# Patient Record
Sex: Male | Born: 1995 | Race: Black or African American | Hispanic: No | Marital: Single | Smoking: Never smoker
Health system: Southern US, Community
[De-identification: ages and names within clinical notes are randomized; demographics above are authoritative.]

## PROBLEM LIST (undated history)

## (undated) DIAGNOSIS — T7840XA Allergy, unspecified, initial encounter: Secondary | ICD-10-CM

## (undated) DIAGNOSIS — M1990 Unspecified osteoarthritis, unspecified site: Secondary | ICD-10-CM

## (undated) DIAGNOSIS — L309 Dermatitis, unspecified: Secondary | ICD-10-CM

## (undated) HISTORY — DX: Allergy, unspecified, initial encounter: T78.40XA

## (undated) HISTORY — PX: MOUTH SURGERY: SHX715

---
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
## 2015-08-02 IMAGING — CR DG CHEST 2V
2 series · 2 of 2 positions shown · non-contrast
Comparison: 05/19/2006

CLINICAL DATA: Right clavicle pain post injury 2 days ago

EXAM:
CHEST  2 VIEW

[w chest pa]
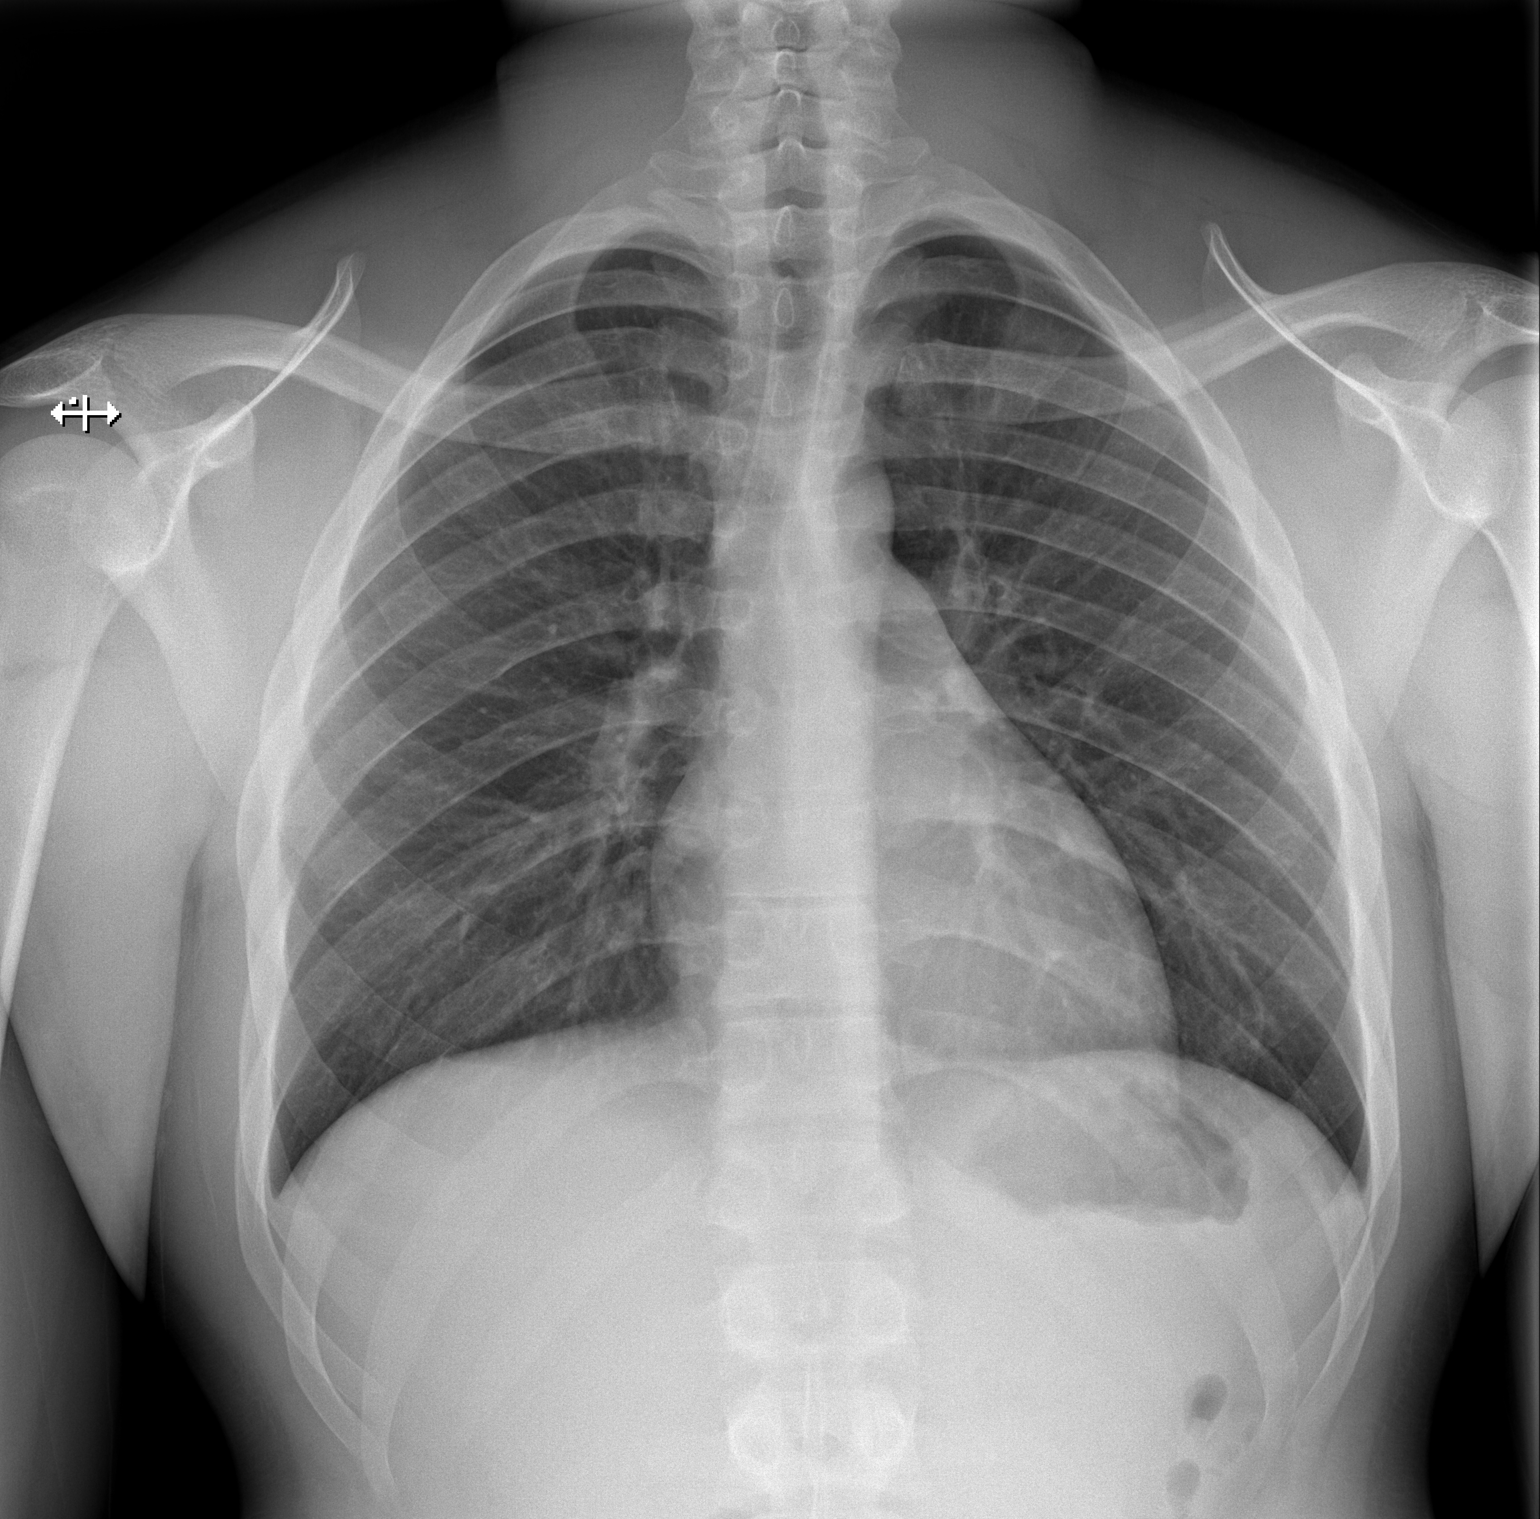

[w chest lat]
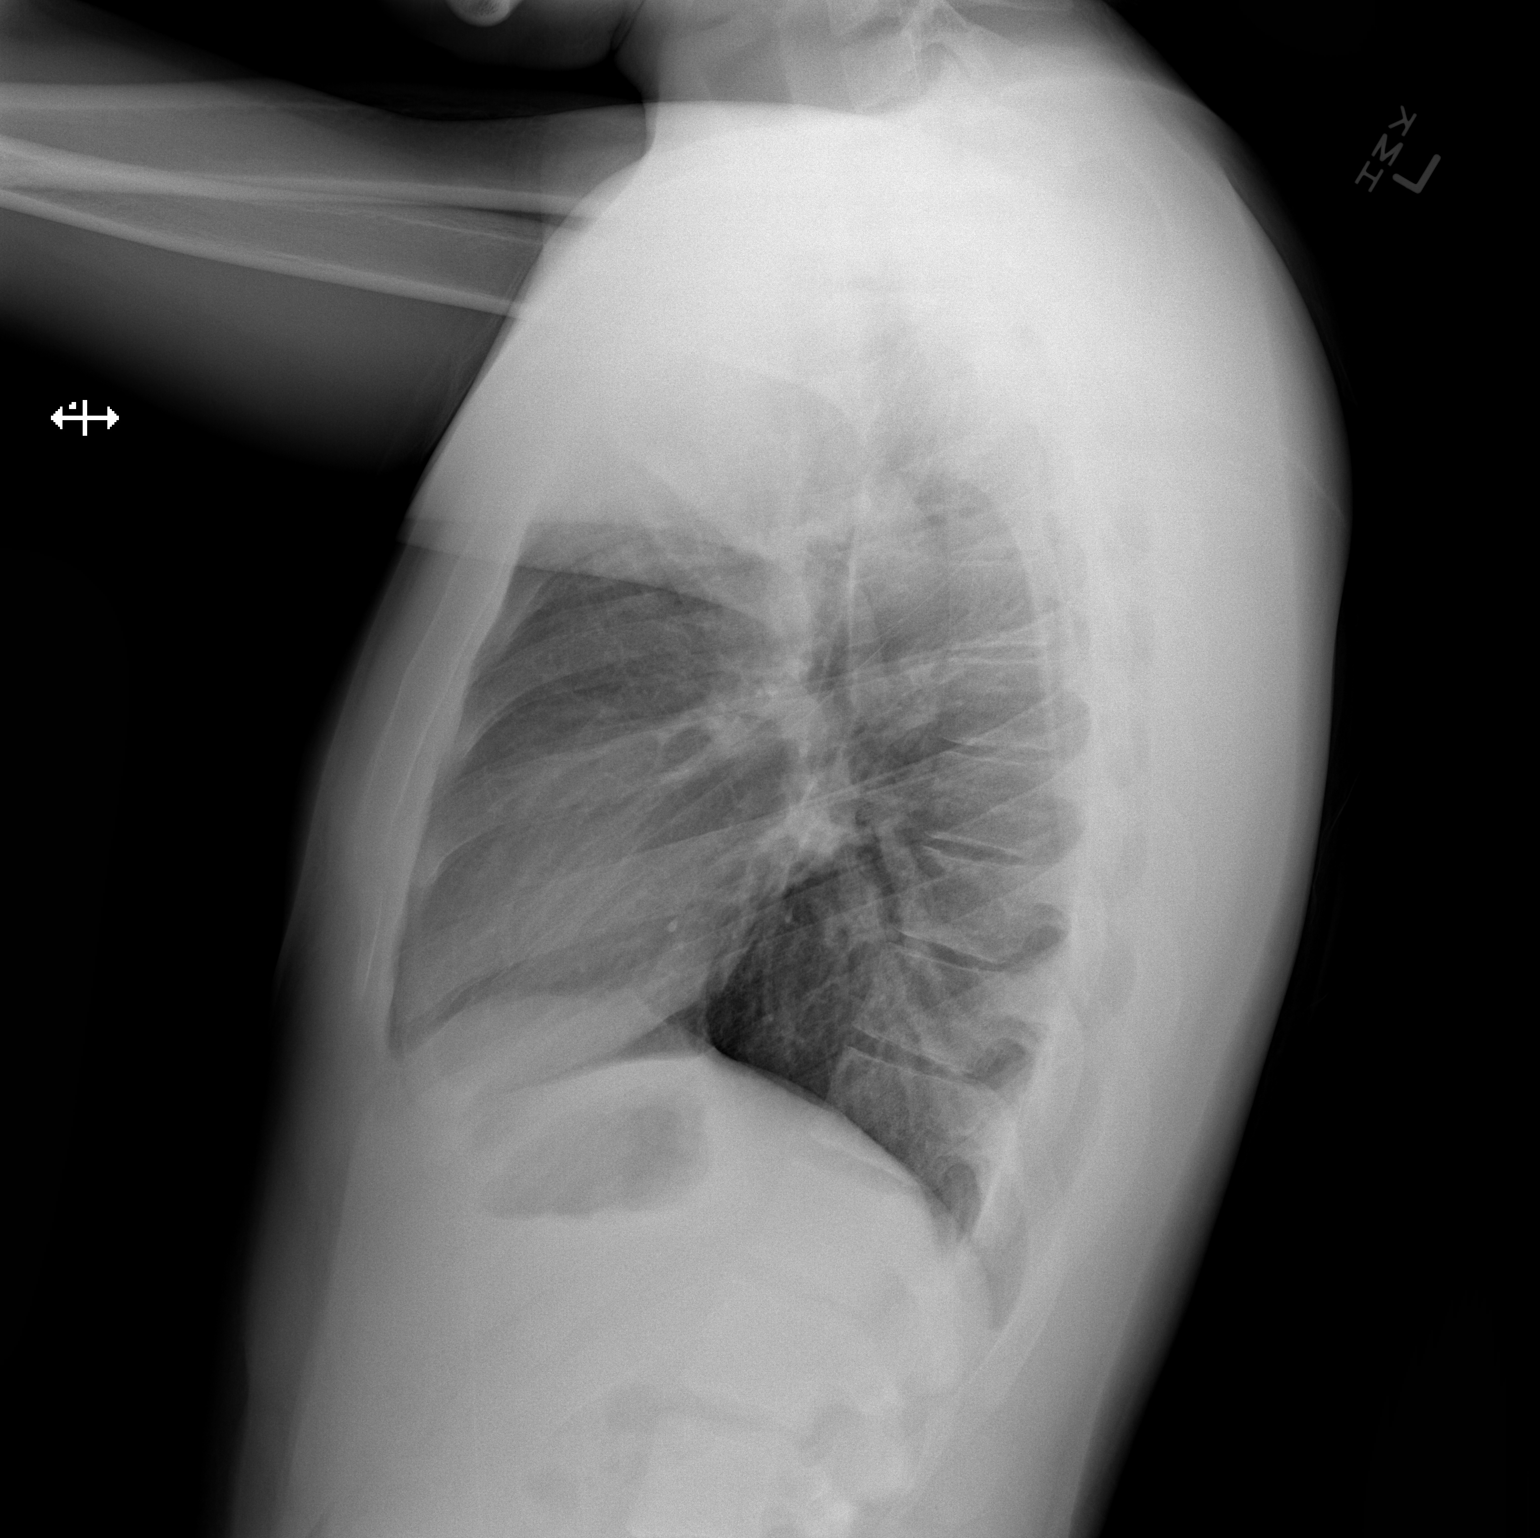

[2 of 2 positions shown; findings below may reference images not displayed]

FINDINGS: Cardiomediastinal silhouette is stable. No acute infiltrate or
pleural effusion. No pulmonary edema. No definite clavicle fracture
is noted. There is slight asymmetry with slight lower position of
the right clavicle sternal joint compared with the left. Although
this may be due positional, inferior subluxation of right clavicle
sternal joint cannot be excluded. Clinical correlation is necessary.
IMPRESSION: No acute infiltrate or pleural effusion. No pulmonary edema. No
definite clavicle fracture is noted. There is slight asymmetry with
slight lower position of the right clavicle sternal joint compared
with the left. Although this may be due positional, inferior
subluxation of right clavicle sternal joint cannot be excluded.
Clinical correlation is necessary.

## 2017-09-12 ENCOUNTER — Emergency Department (HOSPITAL_BASED_OUTPATIENT_CLINIC_OR_DEPARTMENT_OTHER)
Admission: EM | Admit: 2017-09-12 | Discharge: 2017-09-12 | Disposition: A | Discharge status: Home / Self Care | Payer: Self-pay | Attending: Emergency Medicine | Admitting: Emergency Medicine

## 2017-09-12 ENCOUNTER — Other Ambulatory Visit: Payer: Self-pay

## 2017-09-12 ENCOUNTER — Encounter (HOSPITAL_BASED_OUTPATIENT_CLINIC_OR_DEPARTMENT_OTHER): Payer: Self-pay | Admitting: Emergency Medicine

## 2017-09-12 DIAGNOSIS — Z7722 Contact with and (suspected) exposure to environmental tobacco smoke (acute) (chronic): Secondary | ICD-10-CM | POA: Insufficient documentation

## 2017-09-12 DIAGNOSIS — A084 Viral intestinal infection, unspecified: Secondary | ICD-10-CM | POA: Insufficient documentation

## 2017-09-12 MED ORDER — ONDANSETRON 8 MG PO TBDP: 8.0000 mg | ORAL_TABLET | Freq: Three times a day (TID) | ORAL | 0 refills | Status: DC | PRN

## 2017-09-12 MED ORDER — SODIUM CHLORIDE 0.9 % IV BOLUS (SEPSIS)
1000.0000 mL | Freq: Once | INTRAVENOUS | Status: AC
  Administered 2017-09-12: 1000 mL via INTRAVENOUS

## 2017-09-12 MED ORDER — KETOROLAC TROMETHAMINE 15 MG/ML IJ SOLN
INTRAMUSCULAR | Status: AC
  Administered 2017-09-12: 15 mg via INTRAVENOUS
  Filled 2017-09-12: qty 1

## 2017-09-12 MED ORDER — ONDANSETRON HCL 4 MG/2ML IJ SOLN
4.0000 mg | Freq: Once | INTRAMUSCULAR | Status: AC
  Administered 2017-09-12: 4 mg via INTRAVENOUS

## 2017-09-12 MED ORDER — ONDANSETRON HCL 4 MG/2ML IJ SOLN
INTRAMUSCULAR | Status: AC
  Administered 2017-09-12: 4 mg via INTRAVENOUS
  Filled 2017-09-12: qty 2

## 2017-09-12 MED ORDER — KETOROLAC TROMETHAMINE 15 MG/ML IJ SOLN
15.0000 mg | Freq: Once | INTRAMUSCULAR | Status: AC
  Administered 2017-09-12: 15 mg via INTRAVENOUS

## 2017-09-12 NOTE — ED Triage Notes (Signed)
PT presents with c/o generalized body aches, and vomiting for 3 days.

## 2017-09-12 NOTE — ED Notes (Signed)
Pt able to drink without diff

## 2017-09-12 NOTE — ED Provider Notes (Signed)
   [facility] Provider Note: Lowella DellJ. Lane Delfino Friesen, MD, FACEP  CSN: [id number] MRN: [id number] ARRIVAL: 09/12/17 at 0008 ROOM: [facility]07/[facility]07   CHIEF COMPLAINT  Vomiting   HISTORY OF PRESENT ILLNESS  09/12/17 1:55 AM Adam Bernard is a 21 y.o. male with a 3-day history of fever, body aches, headache, nausea, vomiting, diarrhea and abdominal cramping.  He has been taking ibuprofen with partial relief of his headaches.  He has been trying to drink adequate fluids but has not been eating solids.  He rates his current pain is a 9 out of 10.  He denies respiratory symptoms.   Past Medical History:  Diagnosis Date  . Arthritis     History reviewed. No pertinent surgical history.  No family history on file.  Social History   Tobacco Use  . Smoking status: Passive Smoke Exposure - Never Smoker  Substance Use Topics  . Alcohol use: No  . Drug use: No    Prior to Admission medications   Medication Sig Start Date End Date Taking? Authorizing Provider  HYDROcodone-acetaminophen (NORCO/VICODIN) 5-325 MG per tablet Take 2 tablets by mouth every 4 (four) hours as needed. 11/21/14   Joycie Peekartner, Benjamin, PA-C    Allergies Patient has no known allergies.   REVIEW OF SYSTEMS  Negative except as noted here or in the History of Present Illness.   PHYSICAL EXAMINATION  Initial Vital Signs Blood pressure 125/71, pulse 63, temperature 97.8 F (36.6 C), temperature source Oral, resp. rate 18, SpO2 100 %.  Examination General: Well-developed, well-nourished male in no acute distress; appearance consistent with age of record HENT: normocephalic; atraumatic Eyes: pupils equal, round and reactive to light; extraocular muscles intact Neck: supple Heart: regular rate and rhythm Lungs: clear to auscultation bilaterally Abdomen: soft; nondistended; mild epigastric tenderness; no masses or hepatosplenomegaly; bowel sounds present Extremities: No deformity; full range of motion; pulses  normal Neurologic: Awake, alert and oriented; motor function intact in all extremities and symmetric; no facial droop Skin: Warm and dry Psychiatric: Normal mood and affect   RESULTS  Summary of this visit's results, reviewed by myself:   EKG Interpretation  Date/Time:    Ventricular Rate:    PR Interval:    QRS Duration:   QT Interval:    QTC Calculation:   R Axis:     Text Interpretation:        Laboratory Studies: No results found for this or any previous visit (from the past 24 hour(s)). Imaging Studies: No results found.  ED COURSE  Nursing notes and initial vitals signs, including pulse oximetry, reviewed.  Vitals:   09/12/17 0017  BP: 125/71  Pulse: 63  Resp: 18  Temp: 97.8 F (36.6 C)  TempSrc: Oral  SpO2: 100%   4:17 AM Patient feeling better after IV fluids medication.  Drinking fluids without emesis.  PROCEDURES    ED DIAGNOSES     ICD-10-CM   1. Viral gastroenteritis A08.4        Sulaiman Imbert, MD 09/12/17 [phone]

## 2019-04-28 ENCOUNTER — Other Ambulatory Visit: Payer: Self-pay

## 2019-04-28 ENCOUNTER — Ambulatory Visit: Payer: Self-pay | Admitting: Family Medicine

## 2019-04-28 ENCOUNTER — Other Ambulatory Visit (HOSPITAL_COMMUNITY)
Admission: RE | Admit: 2019-04-28 | Discharge: 2019-04-28 | Disposition: A | Discharge status: Home / Self Care | Payer: Medicaid Other | Source: Ambulatory Visit | Attending: Family Medicine | Admitting: Family Medicine

## 2019-04-28 ENCOUNTER — Encounter: Payer: Self-pay | Admitting: Family Medicine

## 2019-04-28 VITALS — BP 118/70 | HR 68 | Ht 75.0 in | Wt 180.8 lb

## 2019-04-28 DIAGNOSIS — Z0000 Encounter for general adult medical examination without abnormal findings: Secondary | ICD-10-CM

## 2019-04-28 DIAGNOSIS — Z113 Encounter for screening for infections with a predominantly sexual mode of transmission: Secondary | ICD-10-CM | POA: Insufficient documentation

## 2019-04-28 DIAGNOSIS — R369 Urethral discharge, unspecified: Secondary | ICD-10-CM

## 2019-04-28 DIAGNOSIS — N368 Other specified disorders of urethra: Secondary | ICD-10-CM

## 2019-04-28 MED ORDER — AZITHROMYCIN 250 MG PO TABS: 1000.0000 mg | ORAL_TABLET | Freq: Once | ORAL | 0 refills | Status: AC

## 2019-04-28 NOTE — Progress Notes (Signed)
Adam Bernard is a 23 y.o. male  Chief Complaint  Patient presents with  . Establish Care    CPE/ denies TDAP/ wants STD testing uncomfortable on top of penis    HPI: *Adam Bernard is a 23 y.o. male here for CPE as a new patient to our office.  He complains of irritation at the tip of his penis, burning sensation with ejaculation, and intermittent, scant urethral discharge. Symptoms x 3 days. No fever, chills. No body aches. No rash, lesions. He is sexually active but denies having unprotected sex.  No known exposure to STD. No dysuria, urgency, frequency, hematuria.   No past medical history on file.  No past surgical history on file.  Social History   Socioeconomic History  . Marital status: Single    Spouse name: Not on file  . Number of children: Not on file  . Years of education: Not on file  . Highest education level: Not on file  Occupational History  . Not on file  Social Needs  . Financial resource strain: Not on file  . Food insecurity    Worry: Not on file    Inability: Not on file  . Transportation needs    Medical: Not on file    Non-medical: Not on file  Tobacco Use  . Smoking status: Light Tobacco Smoker  . Smokeless tobacco: Never Used  Substance and Sexual Activity  . Alcohol use: No  . Drug use: No  . Sexual activity: Not on file  Lifestyle  . Physical activity    Days per week: Not on file    Minutes per session: Not on file  . Stress: Not on file  Relationships  . Social Herbalist on phone: Not on file    Gets together: Not on file    Attends religious service: Not on file    Active member of club or organization: Not on file    Attends meetings of clubs or organizations: Not on file    Relationship status: Not on file  . Intimate partner violence    Fear of current or ex partner: Not on file    Emotionally abused: Not on file    Physically abused: Not on file    Forced sexual activity: Not on file  Other Topics  Concern  . Not on file  Social History Narrative  . Not on file    No family history on file.    There is no immunization history on file for this patient.  Outpatient Encounter Medications as of 04/28/2019  Medication Sig  . azithromycin (ZITHROMAX) 250 MG tablet Take 4 tablets (1,000 mg total) by mouth once for 1 dose.  . [DISCONTINUED] ondansetron (ZOFRAN ODT) 8 MG disintegrating tablet Take 1 tablet (8 mg total) by mouth every 8 (eight) hours as needed.   No facility-administered encounter medications on file as of 04/28/2019.      ROS: Gen: no fever, chills  Skin: no rash, itching ENT: no ear pain, ear drainage, nasal congestion, rhinorrhea, sinus pressure, sore throat Eyes: no blurry vision, double vision Resp: no cough, wheeze,SOB CV: no CP, palpitations, LE edema,  GI: no heartburn, n/v/d/c, abd pain GU: no dysuria, urgency, frequency, hematuria; as above in HPI MSK: no joint pain, myalgias, back pain Neuro: no dizziness, headache, weakness, vertigo Psych: no depression, anxiety, insomnia   No Known Allergies  BP 118/70   Pulse 68   Ht 6\' 3"  (  1.905 m)   Wt 180 lb 12.8 oz (82 kg)   SpO2 99%   BMI 22.60 kg/m   Physical Exam  Constitutional: He is oriented to person, place, and time. He appears well-developed and well-nourished. No distress.  HENT:  Head: Normocephalic and atraumatic.  Right Ear: Tympanic membrane and ear canal normal.  Left Ear: Tympanic membrane and ear canal normal.  Nose: Nose normal.  Mouth/Throat: Oropharynx is clear and moist and mucous membranes are normal.  Neck: Neck supple. No thyromegaly present.  Cardiovascular: Normal rate, regular rhythm, normal heart sounds and intact distal pulses.  Pulmonary/Chest: Effort normal and breath sounds normal. He has no wheezes. He has no rhonchi.  Abdominal: Soft. Bowel sounds are normal. He exhibits no distension and no mass. There is no abdominal tenderness. There is no rebound and no guarding.   Genitourinary:    Testes and penis normal.  No penile tenderness. No discharge found.  Musculoskeletal: Normal range of motion.        General: No edema.  Lymphadenopathy:    He has no cervical adenopathy.       Right: No inguinal adenopathy present.       Left: No inguinal adenopathy present.  Neurological: He is alert and oriented to person, place, and time. He exhibits normal muscle tone. Coordination normal.  Skin: Skin is warm and dry.  Psychiatric: He has a normal mood and affect.     A/P:  1. Screening for STD (sexually transmitted disease) 2. Urethral irritation 3. Urethral discharge - Hepatitis B surface antigen - HIV Antibody (routine testing w rflx) - RPR - Hepatitis C antibody - Urine cytology ancillary only([place]) - will treat empirically for chlamydia and contact pt once results available Rx: - azithromycin (ZITHROMAX) 250 MG tablet; Take 4 tablets (1,000 mg total) by mouth once for 1 dose.  Dispense: 4 tablet; Refill: 0 Discussed plan and reviewed medications with patient, including risks, benefits, and potential side effects. Pt expressed understand. All questions answered.  4. Annual physical exam - UTD on dental, no vision issues - declines HPV, Tdap vaccines - discussed importance of regular CV exercise, healthy diet, adequate sleep - discussed importance of safe sex practices - next CPE in 1 year

## 2019-04-29 LAB — HEPATITIS B SURFACE ANTIGEN: Hepatitis B Surface Ag: NEGATIVE

## 2019-04-29 LAB — HEPATITIS C ANTIBODY: Hep C Virus Ab: <0.1 s/co ratio (ref 0.0–0.9)

## 2019-04-29 LAB — HIV ANTIBODY (ROUTINE TESTING W REFLEX): HIV Screen 4th Generation wRfx: NONREACTIVE

## 2019-04-29 LAB — RPR: RPR Ser Ql: NONREACTIVE

## 2019-04-30 LAB — URINE CYTOLOGY ANCILLARY ONLY
Chlamydia: POSITIVE — AB
Neisseria Gonorrhea: NEGATIVE

## 2019-06-30 ENCOUNTER — Telehealth: Payer: Self-pay

## 2019-06-30 NOTE — Telephone Encounter (Signed)

## 2019-07-01 ENCOUNTER — Ambulatory Visit: Payer: Self-pay | Admitting: Family Medicine

## 2019-07-01 ENCOUNTER — Encounter: Payer: Self-pay | Admitting: Family Medicine

## 2019-07-01 ENCOUNTER — Other Ambulatory Visit (HOSPITAL_COMMUNITY)
Admission: RE | Admit: 2019-07-01 | Discharge: 2019-07-01 | Disposition: A | Discharge status: Home / Self Care | Payer: Medicaid Other | Source: Ambulatory Visit | Attending: Family Medicine | Admitting: Family Medicine

## 2019-07-01 ENCOUNTER — Other Ambulatory Visit: Payer: Self-pay

## 2019-07-01 VITALS — BP 116/66 | HR 72 | Temp 98.0°F | Ht 75.0 in | Wt 174.4 lb

## 2019-07-01 DIAGNOSIS — Z8619 Personal history of other infectious and parasitic diseases: Secondary | ICD-10-CM

## 2019-07-01 DIAGNOSIS — Z202 Contact with and (suspected) exposure to infections with a predominantly sexual mode of transmission: Secondary | ICD-10-CM

## 2019-07-01 DIAGNOSIS — R369 Urethral discharge, unspecified: Secondary | ICD-10-CM

## 2019-07-01 DIAGNOSIS — Z2821 Immunization not carried out because of patient refusal: Secondary | ICD-10-CM

## 2019-07-01 MED ORDER — AZITHROMYCIN 250 MG PO TABS: 1000.0000 mg | ORAL_TABLET | Freq: Once | ORAL | 0 refills | Status: AC

## 2019-07-01 NOTE — Progress Notes (Signed)
Adam Bernard is a 23 y.o. male  Chief Complaint  Patient presents with  . Follow-up    Pt is here today requesting to have complete screening for STD's. He is due for his Tdap and Flu which he declines. He states that his GF was tested yesterday at [facility] and is waiting for results.    HPI: Adam Bernard is a 23 y.o. male here requesting STD testing. Pt complains of urethral discharge and discomfort at the tip of penis x 4 days. No dysuria. No rash.  His girlfriend was tested yesterday, results pending.  He is sexually active, does not consistently use condoms.  Pt denies fever, chills, n/v, rash.  Pt was treated for chlamydia in 03/2019.  Pt declines flu and Tdap vaccines.  History reviewed. No pertinent past medical history.  History reviewed. No pertinent surgical history.  Social History   Socioeconomic History  . Marital status: Single    Spouse name: Not on file  . Number of children: Not on file  . Years of education: Not on file  . Highest education level: Not on file  Occupational History  . Not on file  Social Needs  . Financial resource strain: Not on file  . Food insecurity    Worry: Not on file    Inability: Not on file  . Transportation needs    Medical: Not on file    Non-medical: Not on file  Tobacco Use  . Smoking status: Light Tobacco Smoker  . Smokeless tobacco: Never Used  Substance and Sexual Activity  . Alcohol use: No  . Drug use: No  . Sexual activity: Not on file  Lifestyle  . Physical activity    Days per week: Not on file    Minutes per session: Not on file  . Stress: Not on file  Relationships  . Social Herbalist on phone: Not on file    Gets together: Not on file    Attends religious service: Not on file    Active member of club or organization: Not on file    Attends meetings of clubs or organizations: Not on file    Relationship status: Not on file  . Intimate partner violence    Fear of current or ex  partner: Not on file    Emotionally abused: Not on file    Physically abused: Not on file    Forced sexual activity: Not on file  Other Topics Concern  . Not on file  Social History Narrative  . Not on file    History reviewed. No pertinent family history.   Immunization History  Administered Date(s) Administered  . DTaP 09/19/1996, 12/12/1996, 02/13/1997, 12/17/1997, 06/29/2000  . HPV Quadrivalent 11/14/2009, 01/22/2010, 05/27/2010  . Hepatitis B [birth year] 1996, 09/19/1996, 02/13/1997  . HiB (PRP-OMP) 08/20/1996, 12/12/1996, 02/13/1997, 11/28/1997  . IPV 09/19/1996, 12/12/1996, 02/13/1997, 06/29/2000  . Influenza-Unspecified 07/22/2001, 08/23/2001, 07/09/2010, 05/28/2012, 08/03/2014  . MMR 11/28/1997, 06/29/2000  . Varicella 11/28/1997    No outpatient encounter medications on file as of 07/01/2019.   No facility-administered encounter medications on file as of 07/01/2019.      ROS: Pertinent positives and negatives noted in HPI. Remainder of ROS non-contributory   No Known Allergies  BP 116/66 (BP Location: Right Arm, Patient Position: Sitting, Cuff Size: Normal)   Pulse 72   Temp 98 F (36.7 C) (Oral)   Ht _0  (1.905 m)   Wt 174 lb 6.4 oz (79.1 kg)  SpO2 98%   BMI 21.80 kg/m   Physical Exam  Constitutional: He is oriented to person, place, and time. He appears well-developed and well-nourished. No distress.  Genitourinary:    Testes and penis normal.  No penile erythema or penile tenderness. No discharge found.  Neurological: He is alert and oriented to person, place, and time.  Psychiatric: He has a normal mood and affect. His behavior is normal.     A/P:  1. Urethral discharge 2. H/O chlamydia infection 3. Possible exposure to STD - Hepatitis B surface antigen - HIV Antibody (routine testing w rflx) - RPR - Hepatitis C Antibody - Urine cytology ancillary only([place]) Rx: - azithromycin (ZITHROMAX) 250 MG tablet; Take 4 tablets (1,000 mg total)  by mouth once for 1 dose.  Dispense: 4 tablet; Refill: 0 - partner has been tested and results pending - discussed importance of safe sex practice including regular and consistent condom use - f/u PRN Discussed plan and reviewed medications with patient, including risks, benefits, and potential side effects. Pt expressed understand. All questions answered.   3. Influenza vaccination declined by patient  4. Tetanus, diphtheria, and acellular pertussis (Tdap) vaccination declined

## 2019-07-01 NOTE — Patient Instructions (Signed)

## 2019-07-04 LAB — URINE CYTOLOGY ANCILLARY ONLY
Chlamydia: POSITIVE — AB
Neisseria Gonorrhea: NEGATIVE
Trichomonas: NEGATIVE

## 2019-07-04 LAB — RPR: RPR Ser Ql: NONREACTIVE

## 2019-07-04 LAB — HIV ANTIBODY (ROUTINE TESTING W REFLEX): HIV 1&2 Ab, 4th Generation: NONREACTIVE

## 2019-07-04 LAB — HEPATITIS B SURFACE ANTIGEN: Hepatitis B Surface Ag: NONREACTIVE

## 2019-07-04 LAB — HEPATITIS C ANTIBODY
Hepatitis C Ab: NONREACTIVE
SIGNAL TO CUT-OFF: 0.06 (ref <1.00)

## 2020-02-10 ENCOUNTER — Ambulatory Visit: Payer: Medicaid Other | Admitting: Family Medicine

## 2020-02-29 ENCOUNTER — Other Ambulatory Visit: Payer: Self-pay

## 2020-03-01 ENCOUNTER — Encounter: Payer: Self-pay | Admitting: Family Medicine

## 2020-03-01 ENCOUNTER — Other Ambulatory Visit (HOSPITAL_COMMUNITY)
Admission: RE | Admit: 2020-03-01 | Discharge: 2020-03-01 | Disposition: A | Discharge status: Home / Self Care | Payer: Medicaid Other | Source: Ambulatory Visit | Attending: Family Medicine | Admitting: Family Medicine

## 2020-03-01 ENCOUNTER — Ambulatory Visit: Payer: Self-pay | Admitting: Family Medicine

## 2020-03-01 VITALS — BP 100/80 | HR 78 | Temp 98.3°F | Ht 73.0 in | Wt 171.2 lb

## 2020-03-01 DIAGNOSIS — Z113 Encounter for screening for infections with a predominantly sexual mode of transmission: Secondary | ICD-10-CM | POA: Diagnosis present

## 2020-03-01 DIAGNOSIS — Z0000 Encounter for general adult medical examination without abnormal findings: Secondary | ICD-10-CM

## 2020-03-01 NOTE — Progress Notes (Signed)
Adam Bernard is a 24 y.o. male  Chief Complaint  Patient presents with  . Annual Exam    Pt c/o rt hand pain, x 1 week ago.  Pt said that he has been taking Ibuprofen for the pain it has eased up.    HPI: Adam Bernard is a 24 y.o. male here for annual CPE.   Pt states he hurt his Rt hand likely at work 1 week ago. He has been taking ibuprofen and pain has improved.  No weakness. No numbness or tingling.  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 No symptoms or known exposures.   History reviewed. No pertinent past medical history.  History reviewed. No pertinent surgical history.  Social History   Socioeconomic History  . Marital status: Single    Spouse name: Not on file  . Number of children: Not on file  . Years of education: Not on file  . Highest education level: Not on file  Occupational History  . Not on file  Tobacco Use  . Smoking status: Former Research scientist (life sciences)  . Smokeless tobacco: Never Used  Substance and Sexual Activity  . Alcohol use: No  . Drug use: No  . Sexual activity: Not on file  Other Topics Concern  . Not on file  Social History Narrative  . Not on file   Social Determinants of Health   Financial Resource Strain:   . Difficulty of Paying Living Expenses:   Food Insecurity:   . Worried About Charity fundraiser in the Last Year:   . Arboriculturist in the Last Year:   Transportation Needs:   . Film/video editor (Medical):   Marland Kitchen Lack of Transportation (Non-Medical):   Physical Activity:   . Days of Exercise per Week:   . Minutes of Exercise per Session:   Stress:   . Feeling of Stress :   Social Connections:   . Frequency of Communication with Friends and Family:   . Frequency of Social Gatherings with Friends and Family:   . Attends Religious Services:   . Active Member  of Clubs or Organizations:   . Attends Archivist Meetings:   Marland Kitchen Marital Status:   Intimate Partner Violence:   . Fear of Current or Ex-Partner:   . Emotionally Abused:   Marland Kitchen Physically Abused:   . Sexually Abused:     History reviewed. No pertinent family history.   Immunization History  Administered Date(s) Administered  . DTaP 09/19/1996, 12/12/1996, 02/13/1997, 12/17/1997, 06/29/2000  . HPV Quadrivalent 11/14/2009, 01/22/2010, 05/27/2010  . Hepatitis B 02/18/96, 09/19/1996, 02/13/1997  . HiB (PRP-OMP) 08/20/1996, 12/12/1996, 02/13/1997, 11/28/1997  . IPV 09/19/1996, 12/12/1996, 02/13/1997, 06/29/2000  . Influenza-Unspecified 07/22/2001, 08/23/2001, 07/09/2010, 05/28/2012, 08/03/2014  . MMR 11/28/1997, 06/29/2000  . Varicella 11/28/1997    No outpatient encounter medications on file as of 03/01/2020.   No facility-administered encounter medications on file as of 03/01/2020.     ROS: Gen: no fever, chills  Skin: no rash, itching ENT: no ear pain, ear drainage, nasal congestion, rhinorrhea, sinus pressure, sore throat Eyes: no blurry vision, double vision Resp: no cough, wheeze,SOB CV: no CP, palpitations, LE edema,  GI: no heartburn, n/v/d/c, abd pain GU: no dysuria, urgency, frequency, hematuria; no testicular swelling or masses MSK: as above in HPI Neuro: no dizziness, headache, weakness Psych:  no depression, anxiety, insomnia   No Known Allergies  BP 100/80 (BP Location: Left Arm, Patient Position: Sitting, Cuff Size: Normal)   Pulse 78   Temp 98.3 F (36.8 C) (Temporal)   Ht 6' 1" (1.854 m)   Wt 171 lb 3.2 oz (77.7 kg)   SpO2 98%   BMI 22.59 kg/m   Physical Exam  Constitutional: He is oriented to person, place, and time. He appears well-developed and well-nourished. No distress.  HENT:  Head: Normocephalic and atraumatic.  Right Ear: Tympanic membrane and ear canal normal.  Left Ear: Tympanic membrane and ear canal normal.  Nose: Nose normal.    Mouth/Throat: Oropharynx is clear and moist and mucous membranes are normal.  Neck: No thyromegaly present.  Cardiovascular: Normal rate, regular rhythm, normal heart sounds and intact distal pulses.  Pulmonary/Chest: Effort normal and breath sounds normal. He has no wheezes. He has no rhonchi.  Abdominal: Soft. Bowel sounds are normal. He exhibits no distension and no mass. There is no abdominal tenderness. There is no rebound and no guarding.  Musculoskeletal:        General: No edema. Normal range of motion.     Cervical back: Neck supple.  Lymphadenopathy:    He has no cervical adenopathy.  Neurological: He is alert and oriented to person, place, and time. He exhibits normal muscle tone. Coordination normal.  Skin: Skin is warm and dry.  Psychiatric: He has a normal mood and affect.     A/P:  1. Annual physical exam - due for dental exam/cleaning - discussed importance of regular CV exercise, healthy diet, adequate sleep - immunizations UTD - ALT - AST - Basic metabolic panel - Lipid panel - next CPE in 1 year  2. Screening examination for STD (sexually transmitted disease) - Hepatitis B surface antigen - HIV Antibody (routine testing w rflx) - RPR - Urine cytology ancillary only([place]) - Hepatitis C Antibody    This visit occurred during the SARS-CoV-2 public health emergency.  Safety protocols were in place, including screening questions prior to the visit, additional usage of staff PPE, and extensive cleaning of exam room while observing appropriate contact time as indicated for disinfecting solutions.  

## 2020-03-01 NOTE — Patient Instructions (Signed)
Dr. Lysbeth Penner (dentist)  [facility]

## 2020-03-02 LAB — BASIC METABOLIC PANEL
BUN: 16 mg/dL (ref 6–23)
CO2: 27 mEq/L (ref 19–32)
Calcium: 9.7 mg/dL (ref 8.4–10.5)
Chloride: 104 mEq/L (ref 96–112)
Creatinine, Ser: 0.94 mg/dL (ref 0.40–1.50)
GFR: 119.63 mL/min (ref >60.00)
Glucose, Bld: 81 mg/dL (ref 70–99)
Potassium: 4 mEq/L (ref 3.5–5.1)
Sodium: 137 mEq/L (ref 135–145)

## 2020-03-02 LAB — RPR: RPR Ser Ql: NONREACTIVE

## 2020-03-02 LAB — LIPID PANEL
Cholesterol: 128 mg/dL (ref 0–200)
HDL: 56.1 mg/dL (ref >39.00)
LDL Cholesterol: 64 mg/dL (ref 0–99)
NonHDL: 71.97
Total CHOL/HDL Ratio: 2
Triglycerides: 38 mg/dL (ref 0.0–149.0)
VLDL: 7.6 mg/dL (ref 0.0–40.0)

## 2020-03-02 LAB — AST: AST: 16 U/L (ref 0–37)

## 2020-03-02 LAB — HEPATITIS C ANTIBODY
Hepatitis C Ab: NONREACTIVE
SIGNAL TO CUT-OFF: 0.06 (ref <1.00)

## 2020-03-02 LAB — HEPATITIS B SURFACE ANTIGEN: Hepatitis B Surface Ag: NONREACTIVE

## 2020-03-02 LAB — HIV ANTIBODY (ROUTINE TESTING W REFLEX): HIV 1&2 Ab, 4th Generation: NONREACTIVE

## 2020-03-02 LAB — ALT: ALT: 12 U/L (ref 0–53)

## 2020-03-05 LAB — URINE CYTOLOGY ANCILLARY ONLY
Chlamydia: POSITIVE — AB
Comment: NEGATIVE
Comment: NORMAL
Neisseria Gonorrhea: NEGATIVE

## 2020-03-07 ENCOUNTER — Other Ambulatory Visit: Payer: Self-pay | Admitting: Family Medicine

## 2020-03-07 DIAGNOSIS — A749 Chlamydial infection, unspecified: Secondary | ICD-10-CM

## 2020-03-07 MED ORDER — AZITHROMYCIN 500 MG PO TABS: 1000.0000 mg | ORAL_TABLET | Freq: Once | ORAL | 0 refills | Status: AC

## 2020-09-11 ENCOUNTER — Ambulatory Visit: Admission: EM | Admit: 2020-09-11 | Discharge: 2020-09-11 | Discharge status: Against Medical Advice | Payer: Self-pay

## 2021-01-15 ENCOUNTER — Encounter (HOSPITAL_COMMUNITY): Payer: Self-pay | Admitting: Emergency Medicine

## 2021-01-15 ENCOUNTER — Other Ambulatory Visit: Payer: Self-pay

## 2021-01-15 ENCOUNTER — Ambulatory Visit (HOSPITAL_COMMUNITY)
Admission: EM | Admit: 2021-01-15 | Discharge: 2021-01-15 | Disposition: A | Discharge status: Home / Self Care | Payer: Self-pay | Attending: Family Medicine | Admitting: Family Medicine

## 2021-01-15 DIAGNOSIS — M62830 Muscle spasm of back: Secondary | ICD-10-CM

## 2021-01-15 DIAGNOSIS — S39012A Strain of muscle, fascia and tendon of lower back, initial encounter: Secondary | ICD-10-CM

## 2021-01-15 MED ORDER — CYCLOBENZAPRINE HCL 10 MG PO TABS: ORAL_TABLET | ORAL | 0 refills | Status: DC

## 2021-01-15 MED ORDER — DICLOFENAC SODIUM 75 MG PO TBEC: 75.0000 mg | DELAYED_RELEASE_TABLET | Freq: Two times a day (BID) | ORAL | 0 refills | Status: DC
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
## 2022-05-27 ENCOUNTER — Encounter: Payer: Managed Care, Other (non HMO) | Admitting: Family Medicine

## 2022-10-24 ENCOUNTER — Telehealth: Payer: Self-pay | Admitting: Internal Medicine

## 2022-10-24 ENCOUNTER — Ambulatory Visit
Admission: EM | Admit: 2022-10-24 | Discharge: 2022-10-24 | Disposition: A | Discharge status: Home / Self Care | Payer: Managed Care, Other (non HMO) | Attending: Internal Medicine | Admitting: Internal Medicine

## 2022-10-24 ENCOUNTER — Ambulatory Visit (HOSPITAL_COMMUNITY)
Admit: 2022-10-24 | Discharge: 2022-10-24 | Disposition: A | Discharge status: Home / Self Care | Payer: Managed Care, Other (non HMO) | Attending: Internal Medicine

## 2022-10-24 DIAGNOSIS — Z113 Encounter for screening for infections with a predominantly sexual mode of transmission: Secondary | ICD-10-CM

## 2022-10-24 DIAGNOSIS — N433 Hydrocele, unspecified: Secondary | ICD-10-CM | POA: Diagnosis not present

## 2022-10-24 DIAGNOSIS — N5089 Other specified disorders of the male genital organs: Secondary | ICD-10-CM

## 2022-10-24 DIAGNOSIS — I861 Scrotal varices: Secondary | ICD-10-CM | POA: Diagnosis not present

## 2022-10-24 LAB — POCT URINALYSIS DIP (MANUAL ENTRY)
Bilirubin, UA: NEGATIVE
Blood, UA: NEGATIVE
Glucose, UA: NEGATIVE mg/dL
Ketones, POC UA: NEGATIVE mg/dL
Leukocytes, UA: NEGATIVE
Nitrite, UA: NEGATIVE
Protein Ur, POC: NEGATIVE mg/dL
Spec Grav, UA: 1.025 (ref 1.010–1.025)
Urobilinogen, UA: 0.2 E.U./dL
pH, UA: 6.5 (ref 5.0–8.0)

## 2022-10-24 NOTE — Telephone Encounter (Signed)
Called patient to discuss scrotal ultrasound results.  Advised patient that he will need to follow-up with [facility] for further evaluation and management.  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  Patient provided with contact information for [facility] and advised to follow-up.  Advised patient as an urgent care I cannot provide an official referral.  He was advised that if urology specialist requires an official referral, this will have to go through his family doctor.  All questions answered.  He verbalized understanding and was agreeable.

## 2022-10-24 NOTE — ED Triage Notes (Signed)
Patient presents to UC for urinary freq since Monday\. States he has noted swelling to scrotum. Concerned with UTI.

## 2022-10-24 NOTE — Discharge Instructions (Signed)
Please go to scheduled appointment for ultrasound.  We will call with results.  If symptoms persist or worsen, please go straight to the ER.  Your STD testing is pending.  We will call if it is abnormal.

## 2022-10-24 NOTE — ED Provider Notes (Signed)
[name]    CSN: [id number] Arrival date & time: 10/24/22  0938      History   Chief Complaint Chief Complaint  Patient presents with   Urinary Frequency   Groin Swelling    HPI Adam Bernard is a 27 y.o. male.   Patient presents with left-sided testicular swelling that started yesterday.  He reports that he had pain yesterday but denies pain today.  Denies any injury to the area.  Denies any associated penile discharge, urinary frequency, dysuria, abdominal pain, back pain, fever.  Patient denies any exposure to STD but reports that his girlfriend was recently tested for STDs and was negative.   Urinary Frequency    No past medical history on file.  There are no problems to display for this patient.   No past surgical history on file.     Home Medications    Prior to Admission medications   Medication Sig Start Date End Date Taking? Authorizing Provider  cyclobenzaprine (FLEXERIL) 10 MG tablet Take 1 tablet by mouth 3 times daily as needed for muscle spasm. Warning: May cause drowsiness. 01/15/21   Vanessa Kick, MD  diclofenac (VOLTAREN) 75 MG EC tablet Take 1 tablet (75 mg total) by mouth 2 (two) times daily. 01/15/21   Vanessa Kick, MD  ibuprofen (ADVIL) 200 MG tablet Take 200 mg by mouth every 6 (six) hours as needed.    [provider]    Family History No family history on file.  Social History Social History   Tobacco Use   Smoking status: Former   Smokeless tobacco: Never  Scientific laboratory technician Use: Never used  Substance Use Topics   Alcohol use: Yes   Drug use: Yes    Types: Marijuana     Allergies   Patient has no known allergies.   Review of Systems Review of Systems Per HPI  Physical Exam Triage Vital Signs ED Triage Vitals  Enc Vitals Group     BP 10/24/22 1027 112/70     Pulse Rate 10/24/22 1027 75     Resp 10/24/22 1027 16     Temp 10/24/22 1027 97.9 F (36.6 C)     Temp Source 10/24/22 1027 Oral      SpO2 10/24/22 1027 99 %     Weight --      Height --      Head Circumference --      Peak Flow --      Pain Score 10/24/22 1026 0     Pain Loc --      Pain Edu? --      Excl. in [place]? --    No data found.  Updated Vital Signs BP 112/70 (BP Location: Left Arm)   Pulse 75   Temp 97.9 F (36.6 C) (Oral)   Resp 16   SpO2 99%   Visual Acuity Right Eye Distance:   Left Eye Distance:   Bilateral Distance:    Right Eye Near:   Left Eye Near:    Bilateral Near:     Physical Exam Exam conducted with a chaperone present.  Constitutional:      General: He is not in acute distress.    Appearance: Normal appearance. He is not toxic-appearing or diaphoretic.  HENT:     Head: Normocephalic and atraumatic.  Eyes:     Extraocular Movements: Extraocular movements intact.     Conjunctiva/sclera: Conjunctivae normal.  Pulmonary:     Effort: Pulmonary effort  is normal.  Genitourinary:    Penis: Normal.      Testes: Cremasteric reflex is present.     Comments: Patient has mobile like "bag of worms" appearance to anterior left testicle.  There is no other obvious swelling, tenderness to palpation, discoloration.  Cremasteric reflex present.  Right testicle is normal. Neurological:     General: No focal deficit present.     Mental Status: He is alert and oriented to person, place, and time. Mental status is at baseline.  Psychiatric:        Mood and Affect: Mood normal.        Behavior: Behavior normal.        Thought Content: Thought content normal.        Judgment: Judgment normal.      UC Treatments / Results  Labs (all labs ordered are listed, but only abnormal results are displayed) Labs Reviewed  POCT URINALYSIS DIP (MANUAL ENTRY)  CYTOLOGY, (ORAL, ANAL, URETHRAL) ANCILLARY ONLY    EKG   Radiology No results found.  Procedures Procedures (including critical care time)  Medications Ordered in UC Medications - No data to display  Initial Impression /  Assessment and Plan / UC Course  I have reviewed the triage vital signs and the nursing notes.  Pertinent labs & imaging results that were available during my care of the patient were reviewed by me and considered in my medical decision making (see chart for details).     UA was completed by clinical staff per protocol which was unremarkable.  Highly suspicious of varicocele given appearance of the testicle on exam.  Advised patient it will be best to go to the ER to have ultrasound completed to ensure no other worrisome etiology but he declined this.  Risks associated with not going to ER were discussed with patient.  Patient voiced understanding and accepted risks.  Therefore, will order outpatient scrotum ultrasound which was scheduled for today at 1:15 PM.  Awaiting result.  Low suspicion for testicular torsion given exam.  Low suspicion for infection or epididymitis given physical exam as well.  Cytology swab pending to rule out any other STDs as well.  Given no confirmed associated STD, will await results for treatment if necessary.  Patient was given strict return precautions.  Patient verbalized understanding and was agreeable with plan. Final Clinical Impressions(s) / UC Diagnoses   Final diagnoses:  Testicular swelling, left  Screening examination for venereal disease     Discharge Instructions      Please go to scheduled appointment for ultrasound.  We will call with results.  If symptoms persist or worsen, please go straight to the ER.  Your STD testing is pending.  We will call if it is abnormal.     ED Prescriptions   None    PDMP not reviewed this encounter.   Teodora Medici, [place] 10/24/22 1230

## 2022-10-27 LAB — CYTOLOGY, (ORAL, ANAL, URETHRAL) ANCILLARY ONLY
Chlamydia: NEGATIVE
Comment: NEGATIVE
Comment: NEGATIVE
Comment: NORMAL
Neisseria Gonorrhea: NEGATIVE
Trichomonas: NEGATIVE

## 2022-11-03 ENCOUNTER — Telehealth: Payer: Self-pay

## 2022-11-03 NOTE — Telephone Encounter (Signed)
Mychart msg sent. AS, CMA 

## 2022-12-20 ENCOUNTER — Ambulatory Visit
Admission: EM | Admit: 2022-12-20 | Discharge: 2022-12-20 | Disposition: A | Discharge status: Home / Self Care | Payer: Managed Care, Other (non HMO) | Attending: Internal Medicine | Admitting: Internal Medicine

## 2022-12-20 DIAGNOSIS — N4889 Other specified disorders of penis: Secondary | ICD-10-CM | POA: Insufficient documentation

## 2022-12-20 DIAGNOSIS — N481 Balanitis: Secondary | ICD-10-CM | POA: Diagnosis not present

## 2022-12-20 DIAGNOSIS — Z113 Encounter for screening for infections with a predominantly sexual mode of transmission: Secondary | ICD-10-CM | POA: Diagnosis not present

## 2022-12-20 HISTORY — DX: Dermatitis, unspecified: L30.9

## 2022-12-20 MED ORDER — CLOTRIMAZOLE 1 % EX CREA: TOPICAL_CREAM | CUTANEOUS | 0 refills | Status: DC

## 2022-12-20 NOTE — Discharge Instructions (Signed)
I am concerned that you may have balanitis which is a yeast infection of the skin of your penis.  I have prescribed a cream which you apply to external area of penis but not inside the hole that you pee out of.  STD testing is pending.  Will call if it is abnormal.  Refrain from sexual activity until test results and treatment are complete.

## 2022-12-20 NOTE — ED Triage Notes (Signed)
Pt c/o head of penis hurting x 3 days reports a partner was recently tx for a yeast infection. Denies pain in triage.

## 2022-12-20 NOTE — ED Provider Notes (Signed)
[facility]    CSN: [id number] Arrival date & time: 12/20/22  0815      History   Chief Complaint Chief Complaint  Patient presents with   sti screening    HPI Adam Bernard is a 27 y.o. male.   Patient presents today for penile discomfort.  Patient reports that he has pain at the distal end of his penis surrounding the hole where he urinates.  He denies any penile discharge, significant testicular pain or swelling, dysuria, abdominal pain, back pain, fever.  Patient was seen in January for testicular pain and swelling and had scrotal ultrasound which showed hydrocele.  Was advised to follow-up with urology but reports that he was told that he needs an official referral from family medicine.  He has an appointment next week with family medicine to discuss this.  He states that he has not had any significant increase in pain or swelling in his testicles since the last visit.  Patient reports that penile discomfort started after having intercourse with his male sexual partner.  He states that she told him that she is currently being treated for a yeast infection.  She denies exposure to STD but would like STD testing including blood work for HIV and syphilis.     Past Medical History:  Diagnosis Date   Eczema     There are no problems to display for this patient.   History reviewed. No pertinent surgical history.     Home Medications    Prior to Admission medications   Medication Sig Start Date End Date Taking? Authorizing Provider  clotrimazole (LOTRIMIN) 1 % cream Apply to affected area 2 times daily.  Do not place inside penis hole.  Apply only to outside area. 12/20/22  Yes Briyonna Omara, Hildred Alamin E, FNP  cyclobenzaprine (FLEXERIL) 10 MG tablet Take 1 tablet by mouth 3 times daily as needed for muscle spasm. Warning: May cause drowsiness. 01/15/21   Vanessa Kick, MD  diclofenac (VOLTAREN) 75 MG EC tablet Take 1 tablet (75 mg total) by mouth 2 (two) times daily.  01/15/21   Vanessa Kick, MD  ibuprofen (ADVIL) 200 MG tablet Take 200 mg by mouth every 6 (six) hours as needed.    [provider]    Family History History reviewed. No pertinent family history.  Social History Social History   Tobacco Use   Smoking status: Former   Smokeless tobacco: Never  Scientific laboratory technician Use: Never used  Substance Use Topics   Alcohol use: Yes   Drug use: Yes    Types: Marijuana     Allergies   Patient has no known allergies.   Review of Systems Review of Systems Per HPI  Physical Exam Triage Vital Signs ED Triage Vitals  Enc Vitals Group     BP 12/20/22 0822 128/75     Pulse Rate 12/20/22 0822 90     Resp 12/20/22 0822 16     Temp 12/20/22 0822 98.4 F (36.9 C)     Temp Source 12/20/22 0822 Oral     SpO2 12/20/22 0822 98 %     Weight --      Height --      Head Circumference --      Peak Flow --      Pain Score 12/20/22 0823 0     Pain Loc --      Pain Edu? --      Excl. in [place]? --    No  data found.  Updated Vital Signs BP 128/75 (BP Location: Left Arm)   Pulse 90   Temp 98.4 F (36.9 C) (Oral)   Resp 16   SpO2 98%   Visual Acuity Right Eye Distance:   Left Eye Distance:   Bilateral Distance:    Right Eye Near:   Left Eye Near:    Bilateral Near:     Physical Exam Exam conducted with a chaperone present.  Constitutional:      General: He is not in acute distress.    Appearance: Normal appearance. He is not toxic-appearing or diaphoretic.  HENT:     Head: Normocephalic and atraumatic.  Eyes:     Extraocular Movements: Extraocular movements intact.     Conjunctiva/sclera: Conjunctivae normal.  Pulmonary:     Effort: Pulmonary effort is normal.  Genitourinary:    Penis: Normal.      Testes: Normal. Cremasteric reflex is present.     Comments: No obvious swelling, discoloration, penile discharge noticed.  Testes are normal with no discoloration or swelling. Neurological:     General: No focal deficit  present.     Mental Status: He is alert and oriented to person, place, and time. Mental status is at baseline.  Psychiatric:        Mood and Affect: Mood normal.        Behavior: Behavior normal.        Thought Content: Thought content normal.        Judgment: Judgment normal.      UC Treatments / Results  Labs (all labs ordered are listed, but only abnormal results are displayed) Labs Reviewed  RPR  HIV ANTIBODY (ROUTINE TESTING W REFLEX)  CYTOLOGY, (ORAL, ANAL, URETHRAL) ANCILLARY ONLY    EKG   Radiology No results found.  Procedures Procedures (including critical care time)  Medications Ordered in UC Medications - No data to display  Initial Impression / Assessment and Plan / UC Course  I have reviewed the triage vital signs and the nursing notes.  Pertinent labs & imaging results that were available during my care of the patient were reviewed by me and considered in my medical decision making (see chart for details).     I am suspicious that patient could have the start of balanitis given that his sexual partner has a current yeast infection.  Although, physical exam is not super consistent with this.  Will treat with clotrimazole topically to see if this will help alleviate discomfort.  Cytology swab, HIV, RPR test also pending.  Will await results given no confirmed exposure to STD.  Advised patient to follow-up if any symptoms persist or worsen.  He also has scheduled appointment with family medicine to discuss recent scrotal ultrasound findings and follow-up with urology.  Patient verbalized understanding and was agreeable with plan. Final Clinical Impressions(s) / UC Diagnoses   Final diagnoses:  Balanitis  Screening examination for venereal disease  Penile pain     Discharge Instructions      I am concerned that you may have balanitis which is a yeast infection of the skin of your penis.  I have prescribed a cream which you apply to external area of penis  but not inside the hole that you pee out of.  STD testing is pending.  Will call if it is abnormal.  Refrain from sexual activity until test results and treatment are complete.    ED Prescriptions     Medication Sig Dispense Auth. Provider  clotrimazole (LOTRIMIN) 1 % cream Apply to affected area 2 times daily.  Do not place inside penis hole.  Apply only to outside area. 15 g Teodora Medici, [place]      PDMP not reviewed this encounter.   Teodora Medici, [place] 12/20/22 [phone]

## 2022-12-21 ENCOUNTER — Telehealth: Payer: Managed Care, Other (non HMO) | Admitting: Family

## 2022-12-21 DIAGNOSIS — R369 Urethral discharge, unspecified: Secondary | ICD-10-CM | POA: Diagnosis not present

## 2022-12-21 DIAGNOSIS — Z202 Contact with and (suspected) exposure to infections with a predominantly sexual mode of transmission: Secondary | ICD-10-CM

## 2022-12-21 LAB — HIV ANTIBODY (ROUTINE TESTING W REFLEX): HIV Screen 4th Generation wRfx: NONREACTIVE

## 2022-12-21 LAB — RPR: RPR Ser Ql: NONREACTIVE

## 2022-12-21 MED ORDER — DOXYCYCLINE HYCLATE 100 MG PO TABS: 100.0000 mg | ORAL_TABLET | Freq: Two times a day (BID) | ORAL | 0 refills | Status: DC

## 2022-12-21 NOTE — Progress Notes (Signed)
Virtual Visit Consent   [name], you are scheduled for a virtual visit with a [name] provider today. Just as with appointments in the office, your consent must be obtained to participate. Your consent will be active for this visit and any virtual visit you may have with one of our providers in the next 365 days. If you have a MyChart account, a copy of this consent can be sent to you electronically.  As this is a virtual visit, video technology does not allow for your provider to perform a traditional examination. This may limit your provider's ability to fully assess your condition. If your provider identifies any concerns that need to be evaluated in person or the need to arrange testing (such as labs, EKG, etc.), we will make arrangements to do so. Although advances in technology are sophisticated, we cannot ensure that it will always work on either your end or our end. If the connection with a video visit is poor, the visit may have to be switched to a telephone visit. With either a video or telephone visit, we are not always able to ensure that we have a secure connection.  By engaging in this virtual visit, you consent to the provision of healthcare and authorize for your insurance to be billed (if applicable) for the services provided during this visit. Depending on your insurance coverage, you may receive a charge related to this service.  I need to obtain your verbal consent now. Are you willing to proceed with your visit today? LUQMAAN PAKKALA has provided verbal consent on 12/21/2022 for a virtual visit (video or telephone). Evelina Dun, FNP  Date: 12/21/2022 5:11 PM  Virtual Visit via Video Note   I, Evelina Dun, connected with  Adam Bernard  ([id number], 04-11-1996) on 12/21/22 at  5:00 PM EDT by a video-enabled telemedicine application and verified that I am speaking with the correct person using two identifiers.  Location: Patient: Virtual Visit Location Patient:  Home Provider: Virtual Visit Location Provider: Home Office   I discussed the limitations of evaluation and management by telemedicine and the availability of in person appointments. The patient expressed understanding and agreed to proceed.    History of Present Illness: Adam Bernard is a 27 y.o. who identifies as a male who was assigned male at birth, and is being seen today for dysuria after urinating, penile pain. He also reports clear discharge. He was seen in the ED yesterday and has STD pending.   He was given clotrimazole cream without relief.   No known exposure  to STD.   HPI: HPI  Problems: There are no problems to display for this patient.   Allergies: No Known Allergies Medications:  Current Outpatient Medications:    doxycycline (VIBRA-TABS) 100 MG tablet, Take 1 tablet (100 mg total) by mouth 2 (two) times daily., Disp: 14 tablet, Rfl: 0   clotrimazole (LOTRIMIN) 1 % cream, Apply to affected area 2 times daily.  Do not place inside penis hole.  Apply only to outside area., Disp: 15 g, Rfl: 0   cyclobenzaprine (FLEXERIL) 10 MG tablet, Take 1 tablet by mouth 3 times daily as needed for muscle spasm. Warning: May cause drowsiness., Disp: 21 tablet, Rfl: 0   diclofenac (VOLTAREN) 75 MG EC tablet, Take 1 tablet (75 mg total) by mouth 2 (two) times daily., Disp: 14 tablet, Rfl: 0   ibuprofen (ADVIL) 200 MG tablet, Take 200 mg by mouth every 6 (six) hours as needed., Disp: ,  Rfl:   Observations/Objective: Patient is well-developed, well-nourished in no acute distress.  Resting comfortably  at home.  Head is normocephalic, atraumatic.  No labored breathing.  Speech is clear and coherent with logical content.  Patient is alert and oriented at baseline.   Assessment and Plan: 1. Penile discharge  2. Possible exposure to STD  Labs pending from Urgent Care visit  Start doxycycline 100 mg BID for 7 days Avoid sex  until treatment Safe sex Follow up with PCP   Follow Up  Instructions: I discussed the assessment and treatment plan with the patient. The patient was provided an opportunity to ask questions and all were answered. The patient agreed with the plan and demonstrated an understanding of the instructions.  A copy of instructions were sent to the patient via MyChart unless otherwise noted below.     The patient was advised to call back or seek an in-person evaluation if the symptoms worsen or if the condition fails to improve as anticipated.  Time:  I spent 9 minutes with the patient via telehealth technology discussing the above problems/concerns.    Evelina Dun, FNP

## 2022-12-22 LAB — CYTOLOGY, (ORAL, ANAL, URETHRAL) ANCILLARY ONLY
Chlamydia: NEGATIVE
Comment: NEGATIVE
Comment: NEGATIVE
Comment: NORMAL
Neisseria Gonorrhea: NEGATIVE
Trichomonas: NEGATIVE

## 2022-12-29 ENCOUNTER — Telehealth: Payer: Managed Care, Other (non HMO) | Admitting: Family Medicine

## 2022-12-29 ENCOUNTER — Ambulatory Visit
Admission: RE | Admit: 2022-12-29 | Discharge: 2022-12-29 | Disposition: A | Discharge status: Home / Self Care | Payer: Managed Care, Other (non HMO) | Source: Ambulatory Visit | Attending: Internal Medicine | Admitting: Internal Medicine

## 2022-12-29 VITALS — BP 142/75 | HR 62 | Temp 98.4°F | Resp 16

## 2022-12-29 DIAGNOSIS — N4889 Other specified disorders of penis: Secondary | ICD-10-CM | POA: Diagnosis not present

## 2022-12-29 DIAGNOSIS — R1030 Lower abdominal pain, unspecified: Secondary | ICD-10-CM

## 2022-12-29 LAB — POCT URINALYSIS DIP (MANUAL ENTRY)
Bilirubin, UA: NEGATIVE
Glucose, UA: NEGATIVE mg/dL
Ketones, POC UA: NEGATIVE mg/dL
Leukocytes, UA: NEGATIVE
Nitrite, UA: NEGATIVE
Protein Ur, POC: 30 mg/dL — AB
Spec Grav, UA: >=1.03 — AB (ref 1.010–1.025)
Urobilinogen, UA: 1 E.U./dL
pH, UA: 6 (ref 5.0–8.0)

## 2022-12-29 NOTE — ED Triage Notes (Signed)
Pt c/o irritation on urethra, clear discharge after voiding.   Onset ~ ongoing since last UC visit   - was seen/tested for sti at this uc; results neg - video visit given doxy - completed doxy yesterday - video visit today for urethral irritation told to come for UA.   Reports recent dx of hydrocele

## 2022-12-29 NOTE — Patient Instructions (Signed)
  Adam Bernard, thank you for joining Perlie Mayo, NP for today's virtual visit.  While this provider is not your primary care provider (PCP), if your PCP is located in our provider database this encounter information will be shared with them immediately following your visit.   [facility] account gives you access to today's visit and all your visits, tests, and labs performed at [facility] " click here if you don't have a [facility] account or go to [email or web address]  Consent: (Patient) Adam Bernard provided verbal consent for this virtual visit at the beginning of the encounter.  Current Medications:  Current Outpatient Medications:    clotrimazole (LOTRIMIN) 1 % cream, Apply to affected area 2 times daily.  Do not place inside penis hole.  Apply only to outside area., Disp: 15 g, Rfl: 0   cyclobenzaprine (FLEXERIL) 10 MG tablet, Take 1 tablet by mouth 3 times daily as needed for muscle spasm. Warning: May cause drowsiness., Disp: 21 tablet, Rfl: 0   diclofenac (VOLTAREN) 75 MG EC tablet, Take 1 tablet (75 mg total) by mouth 2 (two) times daily., Disp: 14 tablet, Rfl: 0   doxycycline (VIBRA-TABS) 100 MG tablet, Take 1 tablet (100 mg total) by mouth 2 (two) times daily., Disp: 14 tablet, Rfl: 0   ibuprofen (ADVIL) 200 MG tablet, Take 200 mg by mouth every 6 (six) hours as needed., Disp: , Rfl:    Medications ordered in this encounter:  No orders of the defined types were placed in this encounter.    *If you need refills on other medications prior to your next appointment, please contact your pharmacy*  Follow-Up: Call back or seek an in-person evaluation if the symptoms worsen or if the condition fails to improve as anticipated.  [facility] [phone]  Other Instructions   If you have been instructed to have an in-person evaluation today at a local Urgent Care facility, please use the link below. It will take you to a  list of all of our available [facility], including address, phone number and hours of operation. Please do not delay care.  [facility]  If you or a family member do not have a primary care provider, use the link below to schedule a visit and establish care. When you choose a [facility] primary care physician or advanced practice provider, you gain a long-term partner in health. Find a Primary Care Provider  Learn more about [facility] in-office and virtual care options: [facility]

## 2022-12-29 NOTE — ED Provider Notes (Signed)
Adam Bernard    CSN: [id number] Arrival date & time: 12/29/22  1450      History   Chief Complaint Chief Complaint  Patient presents with   see note    HPI Adam Bernard is a 27 y.o. male.   Patient presents today with persistent penile irritation that has been present for about 2 to 3 months.  Patient was originally seen on 1/26.  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  He was advised to follow-up with urology.  He states that he has to wait on family medicine appointment which is on 4/11 to have an official referral.  He then returned on 3/23 with persistent symptoms.  He was treated for balanitis with clotrimazole cream.  He reports minimal improvement with his cream.  He was also tested for STDs at this visit which were all negative.  He then saw internal medicine by video visit on 3/24 and was prescribed doxycycline given report of penile discharge.  Patient reports that symptoms seemed to improve with doxycycline but are still present.  He is having intermittent testicular swelling but no swelling today.  Reports that the sensation is "tingling sensation in his penis" with an occasional clear penile discharge.  He denies dysuria, urinary frequency, testicular pain, abdominal pain, back pain, fever.     Past Medical History:  Diagnosis Date   Eczema     There are no problems to display for this patient.   History reviewed. No pertinent surgical history.     Home Medications    Prior to Admission medications   Medication Sig Start Date End Date Taking? Authorizing Provider  clotrimazole (LOTRIMIN) 1 % cream Apply to affected area 2 times daily.  Do not place inside penis hole.  Apply only to outside area. 12/20/22   Teodora Medici, FNP  cyclobenzaprine (FLEXERIL) 10 MG tablet Take 1 tablet by mouth 3 times daily as needed for muscle spasm. Warning: May cause drowsiness. 01/15/21   Vanessa Kick, MD   diclofenac (VOLTAREN) 75 MG EC tablet Take 1 tablet (75 mg total) by mouth 2 (two) times daily. 01/15/21   Vanessa Kick, MD  doxycycline (VIBRA-TABS) 100 MG tablet Take 1 tablet (100 mg total) by mouth 2 (two) times daily. 12/21/22   Evelina Dun A, FNP  ibuprofen (ADVIL) 200 MG tablet Take 200 mg by mouth every 6 (six) hours as needed.    [provider]    Family History History reviewed. No pertinent family history.  Social History Social History   Tobacco Use   Smoking status: Former   Smokeless tobacco: Never  Scientific laboratory technician Use: Never used  Substance Use Topics   Alcohol use: Yes   Drug use: Yes    Types: Marijuana     Allergies   Patient has no known allergies.   Review of Systems Review of Systems Per HPI  Physical Exam Triage Vital Signs ED Triage Vitals  Enc Vitals Group     BP 12/29/22 1506 (!) 142/75     Pulse Rate 12/29/22 1506 62     Resp 12/29/22 1506 16     Temp 12/29/22 1506 98.4 F (36.9 C)     Temp Source 12/29/22 1506 Oral     SpO2 12/29/22 1506 97 %     Weight --      Height --      Head Circumference --      Peak  Flow --      Pain Score 12/29/22 1507 0     Pain Loc --      Pain Edu? --      Excl. in [place]? --    No data found.  Updated Vital Signs BP (!) 142/75 (BP Location: Left Arm)   Pulse 62   Temp 98.4 F (36.9 C) (Oral)   Resp 16   SpO2 97%   Visual Acuity Right Eye Distance:   Left Eye Distance:   Bilateral Distance:    Right Eye Near:   Left Eye Near:    Bilateral Near:     Physical Exam Exam conducted with a chaperone present.  Constitutional:      General: He is not in acute distress.    Appearance: Normal appearance. He is not toxic-appearing or diaphoretic.  HENT:     Head: Normocephalic and atraumatic.  Eyes:     Extraocular Movements: Extraocular movements intact.     Conjunctiva/sclera: Conjunctivae normal.  Pulmonary:     Effort: Pulmonary effort is normal.  Genitourinary:    Penis:  Normal.      Testes: Normal. Cremasteric reflex is present.     Comments: Penis appears normal with no rash. Testes appear normal with no swelling or discoloration.  No penile discharge noted. Neurological:     General: No focal deficit present.     Mental Status: He is alert and oriented to person, place, and time. Mental status is at baseline.  Psychiatric:        Mood and Affect: Mood normal.        Behavior: Behavior normal.        Thought Content: Thought content normal.        Judgment: Judgment normal.      UC Treatments / Results  Labs (all labs ordered are listed, but only abnormal results are displayed) Labs Reviewed  POCT URINALYSIS DIP (MANUAL ENTRY) - Abnormal; Notable for the following components:      Result Value   Spec Grav, UA >=1.030 (*)    Blood, UA trace-intact (*)    Protein Ur, POC =30 (*)    All other components within normal limits    EKG   Radiology No results found.  Procedures Procedures (including critical Bernard time)  Medications Ordered in UC Medications - No data to display  Initial Impression / Assessment and Plan / UC Course  I have reviewed the triage vital signs and the nursing notes.  Pertinent labs & imaging results that were available during my Bernard of the patient were reviewed by me and considered in my medical decision making (see chart for details).     UA unremarkable.  No concern for kidney stone at this time despite red blood cells noted given patient's symptoms are not consistent with this.  I do think patient needs to ultimately see urology as I am not sure what else I can provide for patient here in urgent Bernard given limited resources.  Cytology swab just completed which was negative so do not think that additional testing for this is necessary.  No significant testicular swelling so do not think that emergent evaluation is necessary.  Ambulatory urology referral placed for patient.  Advised patient to call them if they do  not call him for an appointment within 48 hours.  Advised strict return precautions.  Patient verbalized understanding and was agreeable with plan. Final Clinical Impressions(s) / UC Diagnoses   Final diagnoses:  Penile irritation  Discharge Instructions      Please follow-up with urology for further evaluation and management.    ED Prescriptions   None    PDMP not reviewed this encounter.   Teodora Medici, [place] 12/29/22 [phone]

## 2022-12-29 NOTE — Discharge Instructions (Signed)
Please follow up with urology for further evaluation and management.

## 2022-12-29 NOTE — Progress Notes (Signed)
Virtual Visit Consent   Adam Bernard, you are scheduled for a virtual visit with a [facility] provider today. Just as with appointments in the office, your consent must be obtained to participate. Your consent will be active for this visit and any virtual visit you may have with one of our providers in the next 365 days. If you have a MyChart account, a copy of this consent can be sent to you electronically.  As this is a virtual visit, video technology does not allow for your provider to perform a traditional examination. This may limit your provider's ability to fully assess your condition. If your provider identifies any concerns that need to be evaluated in person or the need to arrange testing (such as labs, EKG, etc.), we will make arrangements to do so. Although advances in technology are sophisticated, we cannot ensure that it will always work on either your end or our end. If the connection with a video visit is poor, the visit may have to be switched to a telephone visit. With either a video or telephone visit, we are not always able to ensure that we have a secure connection.  By engaging in this virtual visit, you consent to the provision of healthcare and authorize for your insurance to be billed (if applicable) for the services provided during this visit. Depending on your insurance coverage, you may receive a charge related to this service.  I need to obtain your verbal consent now. Are you willing to proceed with your visit today? Adam Bernard has provided verbal consent on 12/29/2022 for a virtual visit (video or telephone). Adam Mayo, NP  Date: 12/29/2022 11:44 AM  Virtual Visit via Video Note   I, Adam Bernard, connected with  Adam Bernard  ([id number], [birth year]) on 12/29/22 at 11:45 AM EDT by a video-enabled telemedicine application and verified that I am speaking with the correct person using two identifiers.  Location: Patient: Virtual Visit Location Patient:  Home Provider: Virtual Visit Location Provider: Home Office   I discussed the limitations of evaluation and management by telemedicine and the availability of in person appointments. The patient expressed understanding and agreed to proceed.    History of Present Illness: Adam Bernard is a 27 y.o. who identifies as a male who was assigned male at birth, and is being seen today for   Now reports having some abdominal after the initial testing of STD and improved with Doxy. Reports still having post urination discomfort and lower abdomen discomfort.    Problems: There are no problems to display for this patient.   Allergies: No Known Allergies Medications:  Current Outpatient Medications:    clotrimazole (LOTRIMIN) 1 % cream, Apply to affected area 2 times daily.  Do not place inside penis hole.  Apply only to outside area., Disp: 15 g, Rfl: 0   cyclobenzaprine (FLEXERIL) 10 MG tablet, Take 1 tablet by mouth 3 times daily as needed for muscle spasm. Warning: May cause drowsiness., Disp: 21 tablet, Rfl: 0   diclofenac (VOLTAREN) 75 MG EC tablet, Take 1 tablet (75 mg total) by mouth 2 (two) times daily., Disp: 14 tablet, Rfl: 0   doxycycline (VIBRA-TABS) 100 MG tablet, Take 1 tablet (100 mg total) by mouth 2 (two) times daily., Disp: 14 tablet, Rfl: 0   ibuprofen (ADVIL) 200 MG tablet, Take 200 mg by mouth every 6 (six) hours as needed., Disp: , Rfl:   Observations/Objective: Patient is well-developed, well-nourished in no acute  distress.  Resting comfortably  at home.  Head is normocephalic, atraumatic.  No labored breathing.  Speech is clear and coherent with logical content.  Patient is alert and oriented at baseline.    Assessment and Plan:  1. Lower abdominal pain  Advised that he should follow up at [facility] near him for possible Repeat testing, UTI testing and follow up for other causes of on going.lingering symptoms  Reviewed side effects, risks and benefits of medication.     Patient acknowledged agreement and understanding of the plan.   Past Medical, Surgical, Social History, Allergies, and Medications have been Reviewed.     Follow Up Instructions: I discussed the assessment and treatment plan with the patient. The patient was provided an opportunity to ask questions and all were answered. The patient agreed with the plan and demonstrated an understanding of the instructions.  A copy of instructions were sent to the patient via MyChart unless otherwise noted below.    The patient was advised to call back or seek an in-person evaluation if the symptoms worsen or if the condition fails to improve as anticipated.  Time:  I spent 10 minutes with the patient via telehealth technology discussing the above problems/concerns.    Adam Mayo, NP

## 2022-12-31 ENCOUNTER — Ambulatory Visit
Admission: EM | Admit: 2022-12-31 | Discharge: 2022-12-31 | Disposition: A | Discharge status: Home / Self Care | Payer: Managed Care, Other (non HMO) | Attending: Emergency Medicine | Admitting: Emergency Medicine

## 2022-12-31 DIAGNOSIS — N368 Other specified disorders of urethra: Secondary | ICD-10-CM

## 2022-12-31 LAB — POCT URINALYSIS DIP (MANUAL ENTRY)
Bilirubin, UA: NEGATIVE
Blood, UA: NEGATIVE
Glucose, UA: NEGATIVE mg/dL
Ketones, POC UA: NEGATIVE mg/dL
Leukocytes, UA: NEGATIVE
Nitrite, UA: NEGATIVE
Protein Ur, POC: 30 mg/dL — AB
Spec Grav, UA: 1.025 (ref 1.010–1.025)
Urobilinogen, UA: 0.2 E.U./dL
pH, UA: 6.5 (ref 5.0–8.0)

## 2022-12-31 MED ORDER — DOXYCYCLINE HYCLATE 100 MG PO CAPS: 100.0000 mg | ORAL_CAPSULE | Freq: Two times a day (BID) | ORAL | 0 refills | Status: DC

## 2022-12-31 MED ORDER — MELOXICAM 15 MG PO TABS: 15.0000 mg | ORAL_TABLET | Freq: Every day | ORAL | 1 refills | Status: DC

## 2022-12-31 NOTE — ED Triage Notes (Signed)
Pt presents with continued urethra irritation, swelling, and pain following antibiotic treatment and negative cytology swab; pt states he has follow up appt with urology on 25th of April.

## 2022-12-31 NOTE — ED Provider Notes (Signed)
[facility]    CSN: [id number] Arrival date & time: 12/31/22  [id number]      History   Chief Complaint Chief Complaint  Patient presents with   Follow-up    HPI Adam Bernard is a 27 y.o. male.   Patient presents for evaluation of urethral irritation and discomfort as well as testicle swelling present for at least 3 weeks.  Has completed multiple urgent care visits and video visits for same symptoms with most recent evaluation 2 days prior.  Has attempted use of naproxen and ibuprofen which has been somewhat helpful.  Was prescribed a course of doxycycline over the past 3 weeks which did help to minimize symptoms but did not resolve.  Has upcoming appointment with urology on April 25 for further evaluation and management .  Sexually active but last encounter prior to symptoms beginning, has completed STD testing since which has been all negative, no known exposures.  Past Medical History:  Diagnosis Date   Eczema     There are no problems to display for this patient.   History reviewed. No pertinent surgical history.     Home Medications    Prior to Admission medications   Medication Sig Start Date End Date Taking? Authorizing Provider  clotrimazole (LOTRIMIN) 1 % cream Apply to affected area 2 times daily.  Do not place inside penis hole.  Apply only to outside area. 12/20/22   Teodora Medici, FNP  cyclobenzaprine (FLEXERIL) 10 MG tablet Take 1 tablet by mouth 3 times daily as needed for muscle spasm. Warning: May cause drowsiness. 01/15/21   Vanessa Kick, MD  diclofenac (VOLTAREN) 75 MG EC tablet Take 1 tablet (75 mg total) by mouth 2 (two) times daily. 01/15/21   Vanessa Kick, MD  doxycycline (VIBRA-TABS) 100 MG tablet Take 1 tablet (100 mg total) by mouth 2 (two) times daily. 12/21/22   Evelina Dun A, FNP  ibuprofen (ADVIL) 200 MG tablet Take 200 mg by mouth every 6 (six) hours as needed.    [provider]    Family History History reviewed. No  pertinent family history.  Social History Social History   Tobacco Use   Smoking status: Former   Smokeless tobacco: Never  Scientific laboratory technician Use: Never used  Substance Use Topics   Alcohol use: Yes   Drug use: Yes    Types: Marijuana     Allergies   Patient has no known allergies.   Review of Systems Review of Systems Defer to HPI  Physical Exam Triage Vital Signs ED Triage Vitals [12/31/22 1025]  Enc Vitals Group     BP 134/67     Pulse Rate 95     Resp 17     Temp 98.4 F (36.9 C)     Temp Source Oral     SpO2 98 %     Weight      Height      Head Circumference      Peak Flow      Pain Score 5     Pain Loc      Pain Edu?      Excl. in [place]?    No data found.  Updated Vital Signs BP 134/67 (BP Location: Left Arm)   Pulse 95   Temp 98.4 F (36.9 C) (Oral)   Resp 17   SpO2 98%   Visual Acuity Right Eye Distance:   Left Eye Distance:   Bilateral Distance:    Right  Eye Near:   Left Eye Near:    Bilateral Near:     Physical Exam Constitutional:      Appearance: Normal appearance.  Eyes:     Extraocular Movements: Extraocular movements intact.  Pulmonary:     Effort: Pulmonary effort is normal.  Genitourinary:    Comments: Left testicle swelling, hanging lower than the right side, no abnormalities to the penis, no discharge present on exam, no erythema, rash or lesions Neurological:     Mental Status: He is alert and oriented to person, place, and time.      UC Treatments / Results  Labs (all labs ordered are listed, but only abnormal results are displayed) Labs Reviewed - No data to display  EKG   Radiology No results found.  Procedures Procedures (including critical care time)  Medications Ordered in UC Medications - No data to display  Initial Impression / Assessment and Plan / UC Course  I have reviewed the triage vital signs and the nursing notes.  Pertinent labs & imaging results that were available during my care of  the patient were reviewed by me and considered in my medical decision making (see chart for details).  Urethral irritation  Urinalysis is negative, no signs of infection on exam however as some improvement was seen with use of antibiotic doxycycline prescribed prophylactically, has had some improvement with use of NSAIDs therefore prescribed meloxicam, strongly advised keeping upcoming appointment with urologist for further evaluation, had similar symptoms in January 2024, ultrasound Doppler completed varicocele and hydrocele confirmed, endorses history since childhood Final Clinical Impressions(s) / UC Diagnoses   Final diagnoses:  None   Discharge Instructions   None    ED Prescriptions   None    PDMP not reviewed this encounter.   Hans Eden, NP 12/31/22 1124

## 2022-12-31 NOTE — Discharge Instructions (Addendum)
Today you were evaluated for urethral irritation  Urinalysis is pending, you will be notified of positive test results only and new medicine sent to pharmacy, you will be able to see all test results on MyChart  Begin use of meloxicam, this is an anti-inflammatory medicine in the same family as naproxen and ibuprofen however it is prescription strength, may take once a day, may take in addition to Tylenol  As symptoms seem to lessen while on antibiotic begin doxycycline prophylactically every morning and every evening for 10 days  Keep upcoming appointment with urologist for further evaluation and management

## 2023-01-03 ENCOUNTER — Other Ambulatory Visit: Payer: Self-pay

## 2023-01-03 ENCOUNTER — Encounter (HOSPITAL_COMMUNITY): Payer: Self-pay

## 2023-01-03 ENCOUNTER — Emergency Department (HOSPITAL_COMMUNITY)
Admission: EM | Admit: 2023-01-03 | Discharge: 2023-01-03 | Disposition: A | Discharge status: Home / Self Care | Payer: Managed Care, Other (non HMO) | Attending: Emergency Medicine | Admitting: Emergency Medicine

## 2023-01-03 DIAGNOSIS — R21 Rash and other nonspecific skin eruption: Secondary | ICD-10-CM | POA: Diagnosis not present

## 2023-01-03 DIAGNOSIS — R590 Localized enlarged lymph nodes: Secondary | ICD-10-CM | POA: Diagnosis not present

## 2023-01-03 DIAGNOSIS — R591 Generalized enlarged lymph nodes: Secondary | ICD-10-CM

## 2023-01-03 NOTE — ED Triage Notes (Signed)
Pt arrived via POV c/o a bump in L neck x1 day. Pt denies pain, cough, and fever.

## 2023-01-03 NOTE — ED Provider Notes (Signed)
  [place] [facility] AT [facility] Provider Note   CSN: [id number] Arrival date & time: 01/03/23  7543     History {Add pertinent medical, surgical, social history, OB history to HPI:1} Chief Complaint  Patient presents with   Cyst    Adam Bernard is a 27 y.o. male.  Patient complains of swelling to the left side of his neck.  Nontender.  Patient has a history of varicocele.   Rash      Home Medications Prior to Admission medications   Medication Sig Start Date End Date Taking? Authorizing Provider  clotrimazole (LOTRIMIN) 1 % cream Apply to affected area 2 times daily.  Do not place inside penis hole.  Apply only to outside area. 12/20/22   Gustavus Bryant, FNP  cyclobenzaprine (FLEXERIL) 10 MG tablet Take 1 tablet by mouth 3 times daily as needed for muscle spasm. Warning: May cause drowsiness. 01/15/21   Mardella Layman, MD  diclofenac (VOLTAREN) 75 MG EC tablet Take 1 tablet (75 mg total) by mouth 2 (two) times daily. 01/15/21   Mardella Layman, MD  doxycycline (VIBRAMYCIN) 100 MG capsule Take 1 capsule (100 mg total) by mouth 2 (two) times daily. 12/31/22   White, Elita Boone, NP  ibuprofen (ADVIL) 200 MG tablet Take 200 mg by mouth every 6 (six) hours as needed.    [provider]  meloxicam (MOBIC) 15 MG tablet Take 1 tablet (15 mg total) by mouth daily. 12/31/22   Valinda Hoar, NP      Allergies    Patient has no known allergies.    Review of Systems   Review of Systems  Skin:  Positive for rash.    Physical Exam Updated Vital Signs BP 125/83 (BP Location: Left Arm)   Pulse 82   Temp 98.2 F (36.8 C) (Oral)   Resp 18   Ht 6\' 2"  (1.88 m)   Wt 72.6 kg   SpO2 100%   BMI 20.54 kg/m  Physical Exam  ED Results / Procedures / Treatments   Labs (all labs ordered are listed, but only abnormal results are displayed) Labs Reviewed - No data to display  EKG None  Radiology No results found.  Procedures Procedures  {Document  cardiac monitor, telemetry assessment procedure when appropriate:1}  Medications Ordered in ED Medications - No data to display  ED Course/ Medical Decision Making/ A&P   {   Click here for ABCD2, HEART and other calculatorsREFRESH Note before signing :1}                          Medical Decision Making  Patient with mild lymphadenopathy in the left side of the neck.  He is already on a nonsteroidal anti-inflammatory.  He will follow-up with ENT if any problem  {Document critical care time when appropriate:1} {Document review of labs and clinical decision tools ie heart score, Chads2Vasc2 etc:1}  {Document your independent review of radiology images, and any outside records:1} {Document your discussion with family members, caretakers, and with consultants:1} {Document social determinants of health affecting pt's care:1} {Document your decision making why or why not admission, treatments were needed:1} Final Clinical Impression(s) / ED Diagnoses Final diagnoses:  Lymphadenopathy    Rx / DC Orders ED Discharge Orders     None

## 2023-01-03 NOTE — Discharge Instructions (Signed)
Follow-up with Dr. Elijah Birk if the lymph node in your neck gets larger, Or becomes very tender

## 2023-01-08 ENCOUNTER — Encounter: Payer: Self-pay | Admitting: Family Medicine

## 2023-01-08 ENCOUNTER — Ambulatory Visit: Payer: Managed Care, Other (non HMO) | Admitting: Family Medicine

## 2023-01-08 VITALS — BP 120/80 | HR 68 | Temp 98.2°F | Ht 74.0 in | Wt 159.4 lb

## 2023-01-08 DIAGNOSIS — Z1159 Encounter for screening for other viral diseases: Secondary | ICD-10-CM | POA: Diagnosis not present

## 2023-01-08 DIAGNOSIS — R61 Generalized hyperhidrosis: Secondary | ICD-10-CM

## 2023-01-08 DIAGNOSIS — R1031 Right lower quadrant pain: Secondary | ICD-10-CM | POA: Diagnosis not present

## 2023-01-08 DIAGNOSIS — R1030 Lower abdominal pain, unspecified: Secondary | ICD-10-CM

## 2023-01-08 DIAGNOSIS — R197 Diarrhea, unspecified: Secondary | ICD-10-CM | POA: Diagnosis not present

## 2023-01-08 LAB — COMPREHENSIVE METABOLIC PANEL
ALT: 19 U/L (ref 0–53)
AST: 21 U/L (ref 0–37)
Albumin: 4.8 g/dL (ref 3.5–5.2)
Alkaline Phosphatase: 56 U/L (ref 39–117)
BUN: 23 mg/dL (ref 6–23)
CO2: 26 mEq/L (ref 19–32)
Calcium: 10 mg/dL (ref 8.4–10.5)
Chloride: 102 mEq/L (ref 96–112)
Creatinine, Ser: 1.14 mg/dL (ref 0.40–1.50)
GFR: 88.75 mL/min (ref >60.00)
Glucose, Bld: 83 mg/dL (ref 70–99)
Potassium: 3.7 mEq/L (ref 3.5–5.1)
Sodium: 137 mEq/L (ref 135–145)
Total Bilirubin: 0.9 mg/dL (ref 0.2–1.2)
Total Protein: 7.9 g/dL (ref 6.0–8.3)

## 2023-01-08 LAB — URINALYSIS, ROUTINE W REFLEX MICROSCOPIC
Hgb urine dipstick: NEGATIVE
Ketones, ur: 40 — AB
Leukocytes,Ua: NEGATIVE
Nitrite: NEGATIVE
Specific Gravity, Urine: >=1.03 — AB (ref 1.000–1.030)
Urine Glucose: NEGATIVE
Urobilinogen, UA: 1 (ref 0.0–1.0)
pH: 6 (ref 5.0–8.0)

## 2023-01-08 LAB — TSH: TSH: 1.98 u[IU]/mL (ref 0.35–5.50)

## 2023-01-08 LAB — CBC WITH DIFFERENTIAL/PLATELET
Basophils Absolute: 0.1 10*3/uL (ref 0.0–0.1)
Basophils Relative: 1.4 % (ref 0.0–3.0)
Eosinophils Absolute: 0.1 10*3/uL (ref 0.0–0.7)
Eosinophils Relative: 1.7 % (ref 0.0–5.0)
HCT: 47.9 % (ref 39.0–52.0)
Hemoglobin: 16.3 g/dL (ref 13.0–17.0)
Lymphocytes Relative: 58.7 % — ABNORMAL HIGH (ref 12.0–46.0)
Lymphs Abs: 2.6 10*3/uL (ref 0.7–4.0)
MCHC: 34 g/dL (ref 30.0–36.0)
MCV: 99.6 fl (ref 78.0–100.0)
Monocytes Absolute: 0.3 10*3/uL (ref 0.1–1.0)
Monocytes Relative: 7.6 % (ref 3.0–12.0)
Neutro Abs: 1.4 10*3/uL (ref 1.4–7.7)
Neutrophils Relative %: 30.6 % — ABNORMAL LOW (ref 43.0–77.0)
Platelets: 195 10*3/uL (ref 150.0–400.0)
RBC: 4.81 Mil/uL (ref 4.22–5.81)
RDW: 12.5 % (ref 11.5–15.5)
WBC: 4.5 10*3/uL (ref 4.0–10.5)

## 2023-01-08 NOTE — Progress Notes (Signed)
Subjective:     Patient ID: Adam Bernard, male    DOB: [birth year], 27 y.o.   MRN: [id number]  Chief Complaint  Patient presents with   Transfer of care   Medication issue    Stopped taking Doxycycline and Meloxicam on Sunday due to swollen lymph nodes and abdominal pain. Not sure if the medication is the cause. Abdominal pain  and lymph node swelling is somewhat better since stopping the medication. This was the second round of the Doxycycline.   Lower back pain    Mostly right side. Started Monday.   Ongoing diarrhea    Started Monday.   Urinary Retention    For three weeks. Has an urology appointment on 4/25.   Abdominal Pain    Intermittent and started around 4/3.    HPI  Diarrhea since 4/8.  Twice daily. No blood.  Occasional pain but not related to diarrhea.  Right flank pain-intermitt-more at night when goes to work.  No injury.   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  Has appointment w/urology on 4/25.  When goes to urinate, no pain, burning,itching, but feels like still has to go and leaking few drops. No decreased stream, no straining.  Drinking more water now so urine not as dark.  No intercourse for 1 month(s).  Only partner is fiancee has been to urgent care or ER 5x since 1/26.  Has been on 14 days of doxycycline recently.  Has been tested for HIV and sexual transmitted infection several times.  Was having night sweats since 4/8 once/night(works nights so sleeps day).  Stopped doxycycline on 4/7 as feeling strange. Went to ER due to swollen lymph node on left neck-improving.   Health Maintenance Due  Topic Date Due   COVID-19 Vaccine (1) Never done   DTaP/Tdap/Td (6 - Tdap) 06/30/2007    Past Medical History:  Diagnosis Date   Eczema     History reviewed. No pertinent surgical history.  No meds   No Known Allergies ROS neg/noncontributory except as noted HPI/below No fever, no sore throat, some allergies Occasional CP-twinge for 1 second in chest area.when  smoking weed No cough/shortness of breath/wheeze No nausea and vomiting No headache(s)/dizziness No depression, SI      Objective:     BP 120/80 (BP Location: Left Arm, Patient Position: Sitting)   Pulse 68   Temp 98.2 F (36.8 C) (Temporal)   Ht 6\' 2"  (1.88 m)   Wt 159 lb 6.4 oz (72.3 kg)   SpO2 98%   BMI 20.47 kg/m  Wt Readings from Last 3 Encounters:  01/08/23 159 lb 6.4 oz (72.3 kg)  01/03/23 160 lb (72.6 kg)  03/01/20 171 lb 3.2 oz (77.7 kg)    Physical Exam   Gen: WDWN NAD AAM HEENT: NCAT, conjunctiva not injected, sclera nonicteric TM WNL B, OP moist, no exudates  NECK:  supple, no thyromegaly, + pea sized mobile node on left ant chain near jaw. no carotid bruits CARDIAC: RRR, S1S2+, no murmur. DP 2+B LUNGS: CTAB. No wheezes ABDOMEN:  BS+, soft, mild pain RLQ , No HSM, no masses. No CVAT EXT:  no edema MSK: no gross abnormalities.  NEURO: A&O x3.  CN II-XII intact.  PSYCH: normal mood. Good eye contact No axillary nodes, ing nodes.    Reviewed ER records spent 45 minutes reviewing chart,getting history, devising and discussing plan w/patient.     Assessment & Plan:   Problem List Items Addressed This Visit   None Visit  Diagnoses     Lower abdominal pain    -  Primary   Relevant Orders   CBC with Differential/Platelet (Completed)   Comprehensive metabolic panel (Completed)   TSH (Completed)   Urine Culture   Urinalysis, Routine w reflex microscopic (Completed)   Right lower quadrant abdominal pain       Relevant Orders   CT ABDOMEN PELVIS W WO CONTRAST   Diarrhea, unspecified type       Night sweats       Screening for viral disease       Relevant Orders   Hepatitis C antibody     1.  Lower abdominal pain-more localized on the right.  Question kidney stones, kidney abnormality, appendix, bowel, other.  Has had scrotal ultrasounds, STI screenings which were all negative.  Some UAs had trace blood and/or elevated specific gravity.  He will be  seeing urology at the end of the month.  Will check CBC, CMP, TSH, urine culture, repeat UA, CT of the abdomen and pelvis. 2.  Diarrhea-once to twice per day.  He was on doxycycline.  Could be C. difficile, or side effects to meds.  For now, try probiotics daily and monitor.  If worse, let us know and we will do stool for C. difficile. 3.  Night sweats-just for past few days.  HIV has been negative.  Differential diagnosis is long.  Will check labs.  If worse, pain worsens-needs to go to the emergency room.  Otherwise, follow-up in 2 weeks  No orders of the defined types were placed in this encounter.   Angelena Sole, MD

## 2023-01-08 NOTE — Patient Instructions (Signed)
Welcome to [facility]! It was a pleasure meeting you today.  Probiotics  PLEASE NOTE:  If you had any LAB tests please let us know if you have not heard back within a few days. You may see your results on MyChart before we have a chance to review them but we will give you a call once they are reviewed by [place]. If we ordered any REFERRALS today, please let us know if you have not heard from their office within the next week.  Let us know through MyChart if you are needing REFILLS, or have your pharmacy send [place] the request. You can also use MyChart to communicate with me or any office staff.  Please try these tips to maintain a healthy lifestyle:  Eat most of your calories during the day when you are active. Eliminate processed foods including packaged sweets (pies, cakes, cookies), reduce intake of potatoes, white bread, white pasta, and white rice. Look for whole grain options, oat flour or almond flour.  Each meal should contain half fruits/vegetables, one quarter protein, and one quarter carbs (no bigger than a computer mouse).  Cut down on sweet beverages. This includes juice, soda, and sweet tea. Also watch fruit intake, though this is a healthier sweet option, it still contains natural sugar! Limit to 3 servings daily.  Drink at least 1 glass of water with each meal and aim for at least 8 glasses per day  Exercise at least 150 minutes every week.

## 2023-01-08 NOTE — Progress Notes (Signed)
Labs look pretty okay except: 1.  Urine still looks a bit dehydrated.  Ketones are also present-is he on a high-protein diet or just not eating enough?  Could be because he was fasting for so long.  Make sure eating and drinking well. 2.  Lymphocytes are a bit elevated-given the lymph node in his neck and some of the night sweats, this may all be viral.  We definitely need to repeat this when I see him in 2 weeks.  He does not need to be fasting for that appointment

## 2023-01-09 LAB — HEPATITIS C ANTIBODY: Hepatitis C Ab: NONREACTIVE

## 2023-01-09 LAB — URINE CULTURE
MICRO NUMBER:: 14812528
Result:: NO GROWTH
SPECIMEN QUALITY:: ADEQUATE

## 2023-01-19 ENCOUNTER — Emergency Department (HOSPITAL_COMMUNITY): Payer: Managed Care, Other (non HMO)

## 2023-01-19 ENCOUNTER — Other Ambulatory Visit: Payer: Self-pay

## 2023-01-19 ENCOUNTER — Encounter (HOSPITAL_COMMUNITY): Payer: Self-pay

## 2023-01-19 ENCOUNTER — Emergency Department (HOSPITAL_COMMUNITY)
Admission: EM | Admit: 2023-01-19 | Discharge: 2023-01-19 | Disposition: A | Discharge status: Home / Self Care | Payer: Managed Care, Other (non HMO) | Attending: Emergency Medicine | Admitting: Emergency Medicine

## 2023-01-19 DIAGNOSIS — S93402A Sprain of unspecified ligament of left ankle, initial encounter: Secondary | ICD-10-CM | POA: Diagnosis not present

## 2023-01-19 DIAGNOSIS — Y9367 Activity, basketball: Secondary | ICD-10-CM | POA: Insufficient documentation

## 2023-01-19 DIAGNOSIS — S93492A Sprain of other ligament of left ankle, initial encounter: Secondary | ICD-10-CM | POA: Diagnosis not present

## 2023-01-19 DIAGNOSIS — M25572 Pain in left ankle and joints of left foot: Secondary | ICD-10-CM | POA: Diagnosis present

## 2023-01-19 DIAGNOSIS — X509XXA Other and unspecified overexertion or strenuous movements or postures, initial encounter: Secondary | ICD-10-CM | POA: Diagnosis not present

## 2023-01-19 MED ORDER — NAPROXEN 500 MG PO TABS: 500.0000 mg | ORAL_TABLET | Freq: Two times a day (BID) | ORAL | 0 refills | Status: AC

## 2023-01-19 NOTE — ED Triage Notes (Signed)
Patient said twisted his left ankle playing basketball yesterday. No numbness or tingling. Painful when he bears weight on foot.

## 2023-01-19 NOTE — Discharge Instructions (Addendum)
You are seen in the emergency department for ankle pain.  Thankfully your x-rays were reassuring without any signs of any fractures or dislocations in this area.  There was some swelling noted on your x-ray which is likely due to an ankle sprain.  I would advise managing this as an ankle sprain which includes rest, ice, compression, elevation.  You should also take anti-inflammatory medication such as ibuprofen or Aleve to reduce the pain and swelling in this area.  Please plan to follow-up with her primary care provider for further evaluation if you feel your symptoms are improving.  If you feel your symptoms are worsening please return to the emergency department for further evaluation.

## 2023-01-19 NOTE — ED Provider Notes (Signed)
[place] EMERGENCY DEPARTMENT AT [facility]The Provider Note   CSN: [id number] Arrival date & time: 01/19/23  1449     History Chief Complaint  Patient presents with   Ankle Pain    Adam Bernard is a 27 y.o. male.  Patient presents emergency department complaints of ankle pain.  He reports that he injured his ankle yesterday while playing basketball and had some difficulty bearing weight soon after the injury.  Denies any numbness or tingling in his feet or legs.  Has been using ankle brace, crutches, naproxen for pain with some improvement and reduction in swelling that he had noted.  Denies any other injuries or impact to the area.   Ankle Pain      Home Medications Prior to Admission medications   Medication Sig Start Date End Date Taking? Authorizing Provider  naproxen (NAPROSYN) 500 MG tablet Take 1 tablet (500 mg total) by mouth 2 (two) times daily. 01/19/23 02/18/23 Yes Smitty Knudsen, PA-C      Allergies    Patient has no known allergies.    Review of Systems   Review of Systems  Musculoskeletal:  Positive for joint swelling.  All other systems reviewed and are negative.   Physical Exam Updated Vital Signs BP (!) 146/84 (BP Location: Left Arm)   Pulse 74   Temp 97.8 F (36.6 C) (Oral)   Resp 15   Ht  (1.88 m)   Wt 74.8 kg   SpO2 100%   BMI 21.18 kg/m  Physical Exam Vitals and nursing note reviewed.  Constitutional:      General: He is not in acute distress.    Appearance: He is well-developed.  HENT:     Head: Normocephalic and atraumatic.  Eyes:     Conjunctiva/sclera: Conjunctivae normal.  Cardiovascular:     Rate and Rhythm: Normal rate and regular rhythm.     Heart sounds: No murmur heard. Pulmonary:     Effort: Pulmonary effort is normal. No respiratory distress.     Breath sounds: Normal breath sounds.  Abdominal:     Palpations: Abdomen is soft.     Tenderness: There is no abdominal tenderness.  Musculoskeletal:         General: Swelling, tenderness and signs of injury present. No deformity.     Cervical back: Neck supple.     Comments: Limited range of motion left ankle due to pain.  Obvious swelling and point tenderness along the ankle joint with more pain along the lateral aspect  Skin:    General: Skin is warm and dry.     Capillary Refill: Capillary refill takes less than 2 seconds.  Neurological:     Mental Status: He is alert.  Psychiatric:        Mood and Affect: Mood normal.     ED Results / Procedures / Treatments   Labs (all labs ordered are listed, but only abnormal results are displayed) Labs Reviewed - No data to display  EKG None  Radiology No results found.  Procedures Procedures   Medications Ordered in ED Medications - No data to display  ED Course/ Medical Decision Making/ A&P                           Medical Decision Making Amount and/or Complexity of Data Reviewed Radiology: ordered.  Risk Prescription drug management.   This patient presents to the ED for concern of ankle pain.  Differential diagnosis includes ankle sprain, ankle fracture, talar fracture, contusion   Imaging Studies ordered:  I ordered imaging studies including x-ray of left ankle I independently visualized and interpreted imaging which showed no obvious fracture dislocations.  Small joint effusion noted I agree with the radiologist interpretation  Problem List / ED Course:  Patient presents emergency department complaints of ankle pain.  He reports that he was playing bass ball yesterday when he rolled his ankle.  Reports that he had some pain and difficulty bearing weight immediately after this injury.  Denies any obvious deformity at that time.  Did note some swelling earlier today that has been improving with rest.  X-ray imaging was ordered which did not show any signs of any fracture or dislocation of the ankle but there is a small joint effusion present.  Given mechanism of injury  and x-ray findings, injuries consistent with an ankle sprain.  Advised patient of use of an ankle brace as well as crutches for additional support and mobility.  Otherwise advised patient to rest the area, use ice, use compression with ankle brace, and elevate the leg to reduce swelling in the area.  Also advised patient that he can use over-the-counter pain medication such as Tylenol, ibuprofen, Aleve.  Patient is agreeable to treatment plan verbalized understanding return precautions.  Work note provided for patient.  All questions answered prior to patient discharge.  Final Clinical Impression(s) / ED Diagnoses Final diagnoses:  Sprain of left ankle, unspecified ligament, initial encounter    Rx / DC Orders ED Discharge Orders          Ordered    naproxen (NAPROSYN) 500 MG tablet  2 times daily        01/19/23 1648              Smitty Knudsen, PA-C 01/19/23 1654    Sloan Leiter, DO 01/20/23 1650

## 2023-01-21 NOTE — Addendum Note (Signed)
Addended by: Jobe Gibbon on: 01/21/2023 04:18 PM   Modules accepted: Orders

## 2023-01-22 ENCOUNTER — Other Ambulatory Visit: Payer: Managed Care, Other (non HMO)

## 2023-01-22 ENCOUNTER — Other Ambulatory Visit: Payer: Self-pay | Admitting: *Deleted

## 2023-01-22 ENCOUNTER — Ambulatory Visit
Admission: RE | Admit: 2023-01-22 | Discharge: 2023-01-22 | Disposition: A | Discharge status: Home / Self Care | Payer: Managed Care, Other (non HMO) | Source: Ambulatory Visit | Attending: Family Medicine | Admitting: Family Medicine

## 2023-01-22 ENCOUNTER — Ambulatory Visit: Payer: Managed Care, Other (non HMO) | Admitting: Family Medicine

## 2023-01-22 DIAGNOSIS — I861 Scrotal varices: Secondary | ICD-10-CM | POA: Diagnosis not present

## 2023-01-22 DIAGNOSIS — R1031 Right lower quadrant pain: Secondary | ICD-10-CM

## 2023-01-22 DIAGNOSIS — R1030 Lower abdominal pain, unspecified: Secondary | ICD-10-CM

## 2023-01-22 DIAGNOSIS — N341 Nonspecific urethritis: Secondary | ICD-10-CM | POA: Diagnosis not present

## 2023-01-23 ENCOUNTER — Encounter: Payer: Self-pay | Admitting: Family Medicine

## 2023-01-23 ENCOUNTER — Ambulatory Visit (INDEPENDENT_AMBULATORY_CARE_PROVIDER_SITE_OTHER): Payer: Managed Care, Other (non HMO) | Admitting: Family Medicine

## 2023-01-23 VITALS — BP 118/70 | HR 74 | Temp 97.7°F | Resp 16 | Ht 74.0 in | Wt 168.5 lb

## 2023-01-23 DIAGNOSIS — D729 Disorder of white blood cells, unspecified: Secondary | ICD-10-CM

## 2023-01-23 DIAGNOSIS — R1030 Lower abdominal pain, unspecified: Secondary | ICD-10-CM

## 2023-01-23 LAB — CBC WITH DIFFERENTIAL/PLATELET
Basophils Absolute: 0 10*3/uL (ref 0.0–0.1)
Basophils Relative: 1.1 % (ref 0.0–3.0)
Eosinophils Absolute: 0.2 10*3/uL (ref 0.0–0.7)
Eosinophils Relative: 3.8 % (ref 0.0–5.0)
HCT: 42.2 % (ref 39.0–52.0)
Hemoglobin: 14.4 g/dL (ref 13.0–17.0)
Lymphocytes Relative: 48.1 % — ABNORMAL HIGH (ref 12.0–46.0)
Lymphs Abs: 2 10*3/uL (ref 0.7–4.0)
MCHC: 34.1 g/dL (ref 30.0–36.0)
MCV: 100.3 fl — ABNORMAL HIGH (ref 78.0–100.0)
Monocytes Absolute: 0.5 10*3/uL (ref 0.1–1.0)
Monocytes Relative: 13.1 % — ABNORMAL HIGH (ref 3.0–12.0)
Neutro Abs: 1.4 10*3/uL (ref 1.4–7.7)
Neutrophils Relative %: 33.9 % — ABNORMAL LOW (ref 43.0–77.0)
Platelets: 223 10*3/uL (ref 150.0–400.0)
RBC: 4.21 Mil/uL — ABNORMAL LOW (ref 4.22–5.81)
RDW: 12.5 % (ref 11.5–15.5)
WBC: 4.2 10*3/uL (ref 4.0–10.5)

## 2023-01-23 NOTE — Progress Notes (Signed)
   Subjective:     Patient ID: Adam Bernard, male    DOB: 1996-03-26, 27 y.o.   MRN: [id number]  Chief Complaint  Patient presents with   Abdominal Pain    2 week follow-up on abdominal pain, comes and goes     HPI  Abdomen pain-insurance wouldn't cover CT w/and without contrast.  They would cover without.  CT done yesterday and normal.  Lymph node in neck-gone.  Sweats gone.  CBCD abnormal.  Urinary retention-saw urological-told "normal".  Got medications-needs to pick up. Follow up only if medications not work. BM-some constipation now so took miralax and then some diarrhea.     Health Maintenance Due  Topic Date Due   DTaP/Tdap/Td (6 - Tdap) 06/30/2007    Past Medical History:  Diagnosis Date   Eczema     History reviewed. No pertinent surgical history.   Current Outpatient Medications:    naproxen (NAPROSYN) 500 MG tablet, Take 1 tablet (500 mg total) by mouth 2 (two) times daily., Disp: 60 tablet, Rfl: 0  No Known Allergies ROS neg/noncontributory except as noted HPI/below      Objective:     BP 118/70   Pulse 74   Temp 97.7 F (36.5 C) (Temporal)   Resp 16   Ht 6\' 2"  (1.88 m)   Wt 168 lb 8 oz (76.4 kg)   SpO2 99%   BMI 21.63 kg/m  Wt Readings from Last 3 Encounters:  01/23/23 168 lb 8 oz (76.4 kg)  01/19/23 165 lb (74.8 kg)  01/08/23 159 lb 6.4 oz (72.3 kg)    Physical Exam   Gen: WDWN NAD HEENT: NCAT, conjunctiva not injected, sclera nonicteric NECK:  supple, no thyromegaly, no nodes-except few mm submand b, no carotid bruits CARDIAC: RRR, S1S2+, no murmur. DP 2+B LUNGS: CTAB. No wheezes ABDOMEN:  BS+, soft, NTND, No HSM, no masses EXT:  no edema MSK: no gross abnormalities.  NEURO: A&O x3.  CN II-XII intact.  PSYCH: normal mood. Good eye contact  Discussed CT     Assessment & Plan:  Abnormal WBC count -     CBC with Differential/Platelet  Lower abdominal pain   Abnormal white blood cell(s)-?patient normal, viral, other.  Will  repeat today.  Patient aware may need to repeat again Abdomninal pain-resolved.  CT non contrast negative Follow up as needed otherwise 6 month(s) annual  Angelena Sole, MD

## 2023-01-23 NOTE — Patient Instructions (Signed)
It was very nice to see you today!  Let me know if things strange   PLEASE NOTE:  If you had any lab tests please let us know if you have not heard back within a few days. You may see your results on MyChart before we have a chance to review them but we will give you a call once they are reviewed by [place]. If we ordered any referrals today, please let us know if you have not heard from their office within the next week.   Please try these tips to maintain a healthy lifestyle:  Eat most of your calories during the day when you are active. Eliminate processed foods including packaged sweets (pies, cakes, cookies), reduce intake of potatoes, white bread, white pasta, and white rice. Look for whole grain options, oat flour or almond flour.  Each meal should contain half fruits/vegetables, one quarter protein, and one quarter carbs (no bigger than a computer mouse).  Cut down on sweet beverages. This includes juice, soda, and sweet tea. Also watch fruit intake, though this is a healthier sweet option, it still contains natural sugar! Limit to 3 servings daily.  Drink at least 1 glass of water with each meal and aim for at least 8 glasses per day  Exercise at least 150 minutes every week.

## 2023-01-25 NOTE — Progress Notes (Signed)
CBC better.  Repeat 2 weeks for stability

## 2023-01-26 ENCOUNTER — Other Ambulatory Visit: Payer: Self-pay | Admitting: *Deleted

## 2023-01-26 DIAGNOSIS — D729 Disorder of white blood cells, unspecified: Secondary | ICD-10-CM

## 2023-01-26 NOTE — Addendum Note (Signed)
Addended by: Jobe Gibbon on: 01/26/2023 09:46 AM   Modules accepted: Orders

## 2023-03-27 ENCOUNTER — Ambulatory Visit: Payer: Managed Care, Other (non HMO) | Admitting: Family Medicine

## 2023-04-01 ENCOUNTER — Ambulatory Visit: Payer: Managed Care, Other (non HMO) | Admitting: Family Medicine

## 2023-04-01 VITALS — BP 121/71 | HR 69 | Temp 98.6°F | Ht 74.0 in | Wt 186.2 lb

## 2023-04-01 DIAGNOSIS — R350 Frequency of micturition: Secondary | ICD-10-CM | POA: Diagnosis not present

## 2023-04-01 DIAGNOSIS — R21 Rash and other nonspecific skin eruption: Secondary | ICD-10-CM

## 2023-04-01 DIAGNOSIS — R1030 Lower abdominal pain, unspecified: Secondary | ICD-10-CM | POA: Diagnosis not present

## 2023-04-01 LAB — CBC WITH DIFFERENTIAL/PLATELET
Basophils Absolute: 0 10*3/uL (ref 0.0–0.1)
Basophils Relative: 1.1 % (ref 0.0–3.0)
Eosinophils Absolute: 0.2 10*3/uL (ref 0.0–0.7)
Eosinophils Relative: 7.2 % — ABNORMAL HIGH (ref 0.0–5.0)
HCT: 45.5 % (ref 39.0–52.0)
Hemoglobin: 14.9 g/dL (ref 13.0–17.0)
Lymphocytes Relative: 50.8 % — ABNORMAL HIGH (ref 12.0–46.0)
Lymphs Abs: 1.7 10*3/uL (ref 0.7–4.0)
MCHC: 32.6 g/dL (ref 30.0–36.0)
MCV: 102.1 fl — ABNORMAL HIGH (ref 78.0–100.0)
Monocytes Absolute: 0.4 10*3/uL (ref 0.1–1.0)
Monocytes Relative: 13 % — ABNORMAL HIGH (ref 3.0–12.0)
Neutro Abs: 0.9 10*3/uL — ABNORMAL LOW (ref 1.4–7.7)
Neutrophils Relative %: 27.9 % — ABNORMAL LOW (ref 43.0–77.0)
Platelets: 207 10*3/uL (ref 150.0–400.0)
RBC: 4.46 Mil/uL (ref 4.22–5.81)
RDW: 12.6 % (ref 11.5–15.5)
WBC: 3.4 10*3/uL — ABNORMAL LOW (ref 4.0–10.5)

## 2023-04-01 LAB — COMPREHENSIVE METABOLIC PANEL
ALT: 12 U/L (ref 0–53)
AST: 17 U/L (ref 0–37)
Albumin: 4.2 g/dL (ref 3.5–5.2)
Alkaline Phosphatase: 52 U/L (ref 39–117)
BUN: 17 mg/dL (ref 6–23)
CO2: 31 mEq/L (ref 19–32)
Calcium: 9.9 mg/dL (ref 8.4–10.5)
Chloride: 103 mEq/L (ref 96–112)
Creatinine, Ser: 1.15 mg/dL (ref 0.40–1.50)
GFR: 87.68 mL/min (ref >60.00)
Glucose, Bld: 81 mg/dL (ref 70–99)
Potassium: 4.3 mEq/L (ref 3.5–5.1)
Sodium: 138 mEq/L (ref 135–145)
Total Bilirubin: 0.7 mg/dL (ref 0.2–1.2)
Total Protein: 7.3 g/dL (ref 6.0–8.3)

## 2023-04-01 LAB — POC URINALSYSI DIPSTICK (AUTOMATED)
Bilirubin, UA: NEGATIVE
Blood, UA: NEGATIVE
Glucose, UA: NEGATIVE
Ketones, UA: NEGATIVE
Leukocytes, UA: NEGATIVE
Nitrite, UA: NEGATIVE
Protein, UA: NEGATIVE
Spec Grav, UA: 1.015 (ref 1.010–1.025)
Urobilinogen, UA: 0.2 E.U./dL
pH, UA: 7 (ref 5.0–8.0)

## 2023-04-01 MED ORDER — TRIAMCINOLONE ACETONIDE 0.1 % EX CREA: 1.0000 | TOPICAL_CREAM | Freq: Two times a day (BID) | CUTANEOUS | 0 refills | Status: DC

## 2023-04-01 NOTE — Progress Notes (Signed)
Subjective:     Patient ID: Adam Bernard, male    DOB: 1996/08/11, 27 y.o.   MRN: [id number]  Chief Complaint  Patient presents with   Rash    Rash all over body that started a few days ago, new itching in different places every day   Abdominal Pain    Off and on abdominal pain Having chills more often    HPI Rash - Rash with itchiness started in groin area and has spread to upper and lower extremities. Noticed the rash about 1-2 days ago. Has eczema, notes this rash is different from his eczema flare ups. Has recently started working out at [employer], so he has tried a topical antifungal cream. Hasn't taken any new supplements, or used any new detergents, soaps, or products.  Abdominal pain - Intermittent abdominal pain and diarrhea starting about week ago. No new changes in diet. Tends to eat spicy food. Denies any vomiting, constipation, or heartburn. Has not seen GI. Has been to ER and seen me.  CT abd no contrast(ins wouldn't approve contrast), unrevealing.   Urinary urgency with pelvic pressure when urinating. Past several days.  Faithful w/fiancee.  No d/c.   Neck pain - reports right sided neck pain for 2 days.   Health Maintenance Due  Topic Date Due   DTaP/Tdap/Td (6 - Tdap) 06/30/2007    Past Medical History:  Diagnosis Date   Eczema     History reviewed. No pertinent surgical history.   Current Outpatient Medications:    triamcinolone cream (KENALOG) 0.1 %, Apply 1 Application topically 2 (two) times daily., Disp: 30 g, Rfl: 0  No Known Allergies ROS neg/noncontributory except as noted HPI/below      Objective:     BP 121/71   Pulse 69   Temp 98.6 F (37 C) (Temporal)   Ht 6\' 2"  (1.88 m)   Wt 186 lb 4 oz (84.5 kg)   SpO2 99%   BMI 23.91 kg/m  Wt Readings from Last 3 Encounters:  04/01/23 186 lb 4 oz (84.5 kg)  01/23/23 168 lb 8 oz (76.4 kg)  01/19/23 165 lb (74.8 kg)    Physical Exam   Gen: WDWN NAD HEENT: NCAT, conjunctiva not injected,  sclera nonicteric, TM WNL B, OP moist, no exudates NECK:  supple, no thyromegaly, no nodes, no carotid bruits CARDIAC: RRR, S1S2+, no murmur. DP 2+B LUNGS: CTAB. No wheezes ABDOMEN:  BS+, soft, NTND, No HSM, no masses, mildly tender suprapubic and RLQ NEURO: A&O x3.  CN II-XII intact.  PSYCH: normal mood. Good eye contact Skin: Hyperpigmentation on back, milia on center of scrotum, non descriptive scattered bumps.     Assessment & Plan:  Rash  Lower abdominal pain -     CBC with Differential/Platelet -     Comprehensive metabolic panel -     Urine Culture -     POCT Urinalysis Dipstick (Automated) -     Ambulatory referral to Gastroenterology  Urinary frequency -     Urine Culture -     POCT Urinalysis Dipstick (Automated)  Other orders -     Triamcinolone Acetonide; Apply 1 Application topically 2 (two) times daily.  Dispense: 30 g; Refill: 0  1 . Rash-nothing on exam.  ?allergy, fungal, eczema, other.  Continue otc but add triamcinolone 0.1% cream.  Worse, not resolving, f/u 2.  Urinary frequency/pressure.  UA neg.  Check cx.  Has seen urol.  Has had STI checks.  Await results  3.  Abd pain-intermitt.  W/u neg thus far.  ?IBS, other.  Check labs.  Refer GI  Return if symptoms worsen or fail to improve.   I,Rachel Rivera,acting as a scribe for Angelena Sole, MD.,have documented all relevant documentation on the behalf of Angelena Sole, MD,as directed by  Angelena Sole, MD while in the presence of Angelena Sole, MD.  I, Angelena Sole, MD, have reviewed all documentation for this visit. The documentation on 04/02/23 for the exam, diagnosis, procedures, and orders are all accurate and complete.   Angelena Sole, MD

## 2023-04-01 NOTE — Patient Instructions (Signed)
Tdap-health dept or check pharmacy

## 2023-04-02 LAB — URINE CULTURE
MICRO NUMBER:: 15158988
Result:: NO GROWTH
SPECIMEN QUALITY:: ADEQUATE

## 2023-04-02 NOTE — Progress Notes (Signed)
Wbc a little low.  And lymphocytes still a little high.  I can refer to hematology, or he can try taking vitamin B12(MCV elevated which could be that) and repeat cbcd,B12 in 1 month?

## 2023-04-03 ENCOUNTER — Other Ambulatory Visit: Payer: Self-pay | Admitting: *Deleted

## 2023-04-03 DIAGNOSIS — D729 Disorder of white blood cells, unspecified: Secondary | ICD-10-CM

## 2023-04-03 DIAGNOSIS — D72820 Lymphocytosis (symptomatic): Secondary | ICD-10-CM

## 2023-04-14 ENCOUNTER — Other Ambulatory Visit: Payer: Self-pay

## 2023-04-14 ENCOUNTER — Emergency Department (HOSPITAL_COMMUNITY)
Admission: EM | Admit: 2023-04-14 | Discharge: 2023-04-14 | Disposition: A | Discharge status: Home / Self Care | Payer: Managed Care, Other (non HMO) | Attending: Emergency Medicine | Admitting: Emergency Medicine

## 2023-04-14 ENCOUNTER — Encounter (HOSPITAL_COMMUNITY): Payer: Self-pay

## 2023-04-14 DIAGNOSIS — Y92410 Unspecified street and highway as the place of occurrence of the external cause: Secondary | ICD-10-CM | POA: Diagnosis not present

## 2023-04-14 DIAGNOSIS — M25562 Pain in left knee: Secondary | ICD-10-CM | POA: Insufficient documentation

## 2023-04-14 MED ORDER — HYDROCODONE-ACETAMINOPHEN 5-325 MG PO TABS
1.0000 | ORAL_TABLET | Freq: Once | ORAL | Status: AC
  Administered 2023-04-14: 1 via ORAL
  Filled 2023-04-14: qty 1

## 2023-04-14 MED ORDER — HYDROCODONE-ACETAMINOPHEN 5-325 MG PO TABS: 1.0000 | ORAL_TABLET | ORAL | 0 refills | Status: DC | PRN

## 2023-04-14 MED ORDER — METHOCARBAMOL 500 MG PO TABS: 500.0000 mg | ORAL_TABLET | Freq: Two times a day (BID) | ORAL | 0 refills | Status: DC

## 2023-04-14 MED ORDER — IBUPROFEN 800 MG PO TABS
800.0000 mg | ORAL_TABLET | Freq: Once | ORAL | Status: AC
  Administered 2023-04-14: 800 mg via ORAL
  Filled 2023-04-14: qty 1
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
## 2023-04-14 NOTE — Discharge Instructions (Signed)
Take the prescribed medication as directed.  Can take motrin between dosing if needed-- up to 800mg  3x daily.   Can use heating pad, warm compress, etc to help with soreness as well. Follow-up with your primary care doctor. Return to the ED for new or worsening symptoms.

## 2023-05-04 ENCOUNTER — Other Ambulatory Visit: Payer: Managed Care, Other (non HMO)

## 2023-05-15 ENCOUNTER — Ambulatory Visit (INDEPENDENT_AMBULATORY_CARE_PROVIDER_SITE_OTHER): Payer: Managed Care, Other (non HMO) | Admitting: Family Medicine

## 2023-05-15 ENCOUNTER — Encounter: Payer: Self-pay | Admitting: Family Medicine

## 2023-05-15 VITALS — BP 108/57 | HR 74 | Temp 98.3°F | Ht 74.0 in | Wt 191.6 lb

## 2023-05-15 DIAGNOSIS — D72820 Lymphocytosis (symptomatic): Secondary | ICD-10-CM

## 2023-05-15 DIAGNOSIS — L2082 Flexural eczema: Secondary | ICD-10-CM | POA: Insufficient documentation

## 2023-05-15 DIAGNOSIS — D729 Disorder of white blood cells, unspecified: Secondary | ICD-10-CM | POA: Diagnosis not present

## 2023-05-15 DIAGNOSIS — M546 Pain in thoracic spine: Secondary | ICD-10-CM | POA: Diagnosis not present

## 2023-05-15 LAB — CBC WITH DIFFERENTIAL/PLATELET
Basophils Absolute: 0 10*3/uL (ref 0.0–0.1)
Basophils Relative: 0.8 % (ref 0.0–3.0)
Eosinophils Absolute: 0.2 10*3/uL (ref 0.0–0.7)
Eosinophils Relative: 5.2 % — ABNORMAL HIGH (ref 0.0–5.0)
HCT: 42.6 % (ref 39.0–52.0)
Hemoglobin: 13.8 g/dL (ref 13.0–17.0)
Lymphocytes Relative: 44.4 % (ref 12.0–46.0)
Lymphs Abs: 1.7 10*3/uL (ref 0.7–4.0)
MCHC: 32.5 g/dL (ref 30.0–36.0)
MCV: 101.5 fl — ABNORMAL HIGH (ref 78.0–100.0)
Monocytes Absolute: 0.3 10*3/uL (ref 0.1–1.0)
Monocytes Relative: 8.7 % (ref 3.0–12.0)
Neutro Abs: 1.5 10*3/uL (ref 1.4–7.7)
Neutrophils Relative %: 40.9 % — ABNORMAL LOW (ref 43.0–77.0)
Platelets: 174 10*3/uL (ref 150.0–400.0)
RBC: 4.19 Mil/uL — ABNORMAL LOW (ref 4.22–5.81)
RDW: 13.2 % (ref 11.5–15.5)
WBC: 3.8 10*3/uL — ABNORMAL LOW (ref 4.0–10.5)

## 2023-05-15 LAB — VITAMIN B12: Vitamin B-12: 252 pg/mL (ref 211–911)

## 2023-05-15 MED ORDER — METHOCARBAMOL 500 MG PO TABS: 500.0000 mg | ORAL_TABLET | Freq: Two times a day (BID) | ORAL | 2 refills | Status: DC

## 2023-05-15 MED ORDER — TRIAMCINOLONE ACETONIDE 0.1 % EX CREA: 1.0000 | TOPICAL_CREAM | Freq: Two times a day (BID) | CUTANEOUS | 1 refills | Status: DC

## 2023-05-15 NOTE — Progress Notes (Signed)
Some things better, some slightly worse(hemoglobin).  B12 low end of normal.  Take B12 vitamins otc 2073mcg/day and repeat cbcd,B12 in 6 weeks or so
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
## 2023-05-15 NOTE — Patient Instructions (Signed)
It was very nice to see you today!  T spine stretches.  Peanut.     PLEASE NOTE:  If you had any lab tests please let us know if you have not heard back within a few days. You may see your results on MyChart before we have a chance to review them but we will give you a call once they are reviewed by [place]. If we ordered any referrals today, please let us know if you have not heard from their office within the next week.   Please try these tips to maintain a healthy lifestyle:  Eat most of your calories during the day when you are active. Eliminate processed foods including packaged sweets (pies, cakes, cookies), reduce intake of potatoes, white bread, white pasta, and white rice. Look for whole grain options, oat flour or almond flour.  Each meal should contain half fruits/vegetables, one quarter protein, and one quarter carbs (no bigger than a computer mouse).  Cut down on sweet beverages. This includes juice, soda, and sweet tea. Also watch fruit intake, though this is a healthier sweet option, it still contains natural sugar! Limit to 3 servings daily.  Drink at least 1 glass of water with each meal and aim for at least 8 glasses per day  Exercise at least 150 minutes every week.

## 2023-05-15 NOTE — Assessment & Plan Note (Signed)
Chronic.  Moisturizers.  Triamcinolone cream 0.1% for flares.

## 2023-05-19 ENCOUNTER — Other Ambulatory Visit: Payer: Self-pay | Admitting: *Deleted

## 2023-05-19 DIAGNOSIS — R710 Precipitous drop in hematocrit: Secondary | ICD-10-CM

## 2023-05-19 DIAGNOSIS — E538 Deficiency of other specified B group vitamins: Secondary | ICD-10-CM

## 2023-05-20 ENCOUNTER — Ambulatory Visit: Payer: Managed Care, Other (non HMO) | Admitting: Family Medicine

## 2023-07-10 ENCOUNTER — Encounter: Payer: Self-pay | Admitting: Gastroenterology

## 2023-07-15 ENCOUNTER — Ambulatory Visit: Payer: Managed Care, Other (non HMO) | Admitting: Gastroenterology

## 2023-07-15 NOTE — Progress Notes (Deleted)
   HPI :     CT Abdomen/pelvis without contrast January 22, 2023 IMPRESSION: 1. No CT evidence of acute abdominal/pelvic process. 2. No evidence of appendicitis on this noncontrasted exam. 3. No evidence of nephrolithiasis or hydronephrosis.  Past Medical History:  Diagnosis Date   Eczema      No past surgical history on file. No family history on file. Social History   Tobacco Use   Smoking status: Never   Smokeless tobacco: Never  Vaping Use   Vaping status: Former  Substance Use Topics   Drug use: Yes    Types: Marijuana   Current Outpatient Medications  Medication Sig Dispense Refill   methocarbamol (ROBAXIN) 500 MG tablet Take 1 tablet (500 mg total) by mouth 2 (two) times daily. 20 tablet 2   triamcinolone cream (KENALOG) 0.1 % Apply 1 Application topically 2 (two) times daily. 30 g 1   No current facility-administered medications for this visit.   No Known Allergies   Review of Systems: All systems reviewed and negative except where noted in HPI.    No results found.  Physical Exam: There were no vitals taken for this visit. Constitutional: Pleasant,well-developed, ***male in no acute distress. HEENT: Normocephalic and atraumatic. Conjunctivae are normal. No scleral icterus. Neck supple.  Cardiovascular: Normal rate, regular rhythm.  Pulmonary/chest: Effort normal and breath sounds normal. No wheezing, rales or rhonchi. Abdominal: Soft, nondistended, nontender. Bowel sounds active throughout. There are no masses palpable. No hepatomegaly. Extremities: no edema Lymphadenopathy: No cervical adenopathy noted. Neurological: Alert and oriented to person place and time. Skin: Skin is warm and dry. No rashes noted. Psychiatric: Normal mood and affect. Behavior is normal.  CBC    Component Value Date/Time   WBC 3.8 (L) 05/15/2023 1153   RBC 4.19 (L) 05/15/2023 1153   HGB 13.8 05/15/2023 1153   HCT 42.6 05/15/2023 1153   PLT 174.0 05/15/2023 1153    MCV 101.5 (H) 05/15/2023 1153   MCHC 32.5 05/15/2023 1153   RDW 13.2 05/15/2023 1153   LYMPHSABS 1.7 05/15/2023 1153   MONOABS 0.3 05/15/2023 1153   EOSABS 0.2 05/15/2023 1153   BASOSABS 0.0 05/15/2023 1153    CMP     Component Value Date/Time   NA 138 04/01/2023 1357   K 4.3 04/01/2023 1357   CL 103 04/01/2023 1357   CO2 31 04/01/2023 1357   GLUCOSE 81 04/01/2023 1357   BUN 17 04/01/2023 1357   CREATININE 1.15 04/01/2023 1357   CALCIUM 9.9 04/01/2023 1357   PROT 7.3 04/01/2023 1357   ALBUMIN 4.2 04/01/2023 1357   AST 17 04/01/2023 1357   ALT 12 04/01/2023 1357   ALKPHOS 52 04/01/2023 1357   BILITOT 0.7 04/01/2023 1357       Latest Ref Rng & Units 05/15/2023   11:53 AM 04/01/2023    1:57 PM 01/23/2023    9:27 AM  CBC EXTENDED  WBC 4.0 - 10.5 K/uL 3.8  3.4  4.2   RBC 4.22 - 5.81 Mil/uL 4.19  4.46  4.21   Hemoglobin 13.0 - 17.0 g/dL 16.1  09.6  04.5   HCT 39.0 - 52.0 % 42.6  45.5  42.2   Platelets 150.0 - 400.0 K/uL 174.0  207.0  223.0   NEUT# 1.4 - 7.7 K/uL 1.5  0.9  1.4   Lymph# 0.7 - 4.0 K/uL 1.7  1.7  2.0       ASSESSMENT AND PLAN:  Jeani Sow, MD

## 2023-07-27 ENCOUNTER — Ambulatory Visit (INDEPENDENT_AMBULATORY_CARE_PROVIDER_SITE_OTHER): Payer: Managed Care, Other (non HMO) | Admitting: Family Medicine

## 2023-07-27 ENCOUNTER — Encounter: Payer: Self-pay | Admitting: Family Medicine

## 2023-07-27 VITALS — BP 118/66 | HR 62 | Temp 99.5°F | Resp 18 | Ht 74.0 in | Wt 198.5 lb

## 2023-07-27 DIAGNOSIS — E538 Deficiency of other specified B group vitamins: Secondary | ICD-10-CM | POA: Diagnosis not present

## 2023-07-27 DIAGNOSIS — Z131 Encounter for screening for diabetes mellitus: Secondary | ICD-10-CM

## 2023-07-27 DIAGNOSIS — L2082 Flexural eczema: Secondary | ICD-10-CM

## 2023-07-27 DIAGNOSIS — J301 Allergic rhinitis due to pollen: Secondary | ICD-10-CM | POA: Insufficient documentation

## 2023-07-27 DIAGNOSIS — Z0000 Encounter for general adult medical examination without abnormal findings: Secondary | ICD-10-CM | POA: Diagnosis not present

## 2023-07-27 DIAGNOSIS — Z13220 Encounter for screening for lipoid disorders: Secondary | ICD-10-CM | POA: Diagnosis not present

## 2023-07-27 LAB — CBC WITH DIFFERENTIAL/PLATELET
Basophils Absolute: 0 10*3/uL (ref 0.0–0.1)
Basophils Relative: 0.8 % (ref 0.0–3.0)
Eosinophils Absolute: 0.2 10*3/uL (ref 0.0–0.7)
Eosinophils Relative: 6.3 % — ABNORMAL HIGH (ref 0.0–5.0)
HCT: 46.5 % (ref 39.0–52.0)
Hemoglobin: 15.1 g/dL (ref 13.0–17.0)
Lymphocytes Relative: 53.4 % — ABNORMAL HIGH (ref 12.0–46.0)
Lymphs Abs: 1.7 10*3/uL (ref 0.7–4.0)
MCHC: 32.6 g/dL (ref 30.0–36.0)
MCV: 102.6 fL — ABNORMAL HIGH (ref 78.0–100.0)
Monocytes Absolute: 0.3 10*3/uL (ref 0.1–1.0)
Monocytes Relative: 10.1 % (ref 3.0–12.0)
Neutro Abs: 0.9 10*3/uL — ABNORMAL LOW (ref 1.4–7.7)
Neutrophils Relative %: 29.4 % — ABNORMAL LOW (ref 43.0–77.0)
Platelets: 185 10*3/uL (ref 150.0–400.0)
RBC: 4.53 Mil/uL (ref 4.22–5.81)
RDW: 13.1 % (ref 11.5–15.5)
WBC: 3.2 10*3/uL — ABNORMAL LOW (ref 4.0–10.5)

## 2023-07-27 LAB — COMPREHENSIVE METABOLIC PANEL
ALT: 14 U/L (ref 0–53)
AST: 16 U/L (ref 0–37)
Albumin: 4.4 g/dL (ref 3.5–5.2)
Alkaline Phosphatase: 57 U/L (ref 39–117)
BUN: 15 mg/dL (ref 6–23)
CO2: 28 meq/L (ref 19–32)
Calcium: 9.7 mg/dL (ref 8.4–10.5)
Chloride: 104 meq/L (ref 96–112)
Creatinine, Ser: 1.02 mg/dL (ref 0.40–1.50)
GFR: 101.03 mL/min (ref >60.00)
Glucose, Bld: 83 mg/dL (ref 70–99)
Potassium: 4.1 meq/L (ref 3.5–5.1)
Sodium: 138 meq/L (ref 135–145)
Total Bilirubin: 0.8 mg/dL (ref 0.2–1.2)
Total Protein: 7.4 g/dL (ref 6.0–8.3)

## 2023-07-27 LAB — LIPID PANEL
Cholesterol: 144 mg/dL (ref 0–200)
HDL: 61.1 mg/dL (ref >39.00)
LDL Cholesterol: 73 mg/dL (ref 0–99)
NonHDL: 83.21
Total CHOL/HDL Ratio: 2
Triglycerides: 53 mg/dL (ref 0.0–149.0)
VLDL: 10.6 mg/dL (ref 0.0–40.0)

## 2023-07-27 LAB — TSH: TSH: 0.84 u[IU]/mL (ref 0.35–5.50)

## 2023-07-27 LAB — VITAMIN B12: Vitamin B-12: 337 pg/mL (ref 211–911)

## 2023-07-27 LAB — HEMOGLOBIN A1C: Hgb A1c MFr Bld: 4.5 % — ABNORMAL LOW (ref 4.6–6.5)

## 2023-07-27 MED ORDER — TRIAMCINOLONE ACETONIDE 55 MCG/ACT NA AERO: 2.0000 | INHALATION_SPRAY | Freq: Every day | NASAL | 3 refills | Status: DC

## 2023-07-27 MED ORDER — MONTELUKAST SODIUM 10 MG PO TABS: 10.0000 mg | ORAL_TABLET | Freq: Every day | ORAL | 3 refills | Status: DC

## 2023-07-27 MED ORDER — TRIAMCINOLONE ACETONIDE 0.1 % EX CREA
1.0000 | TOPICAL_CREAM | Freq: Two times a day (BID) | CUTANEOUS | 1 refills | Status: DC
Start: 2023-07-27 — End: 2024-07-28

## 2023-07-27 MED ORDER — LORATADINE 10 MG PO TABS: 10.0000 mg | ORAL_TABLET | Freq: Every day | ORAL | 3 refills | Status: DC

## 2023-07-27 NOTE — Progress Notes (Signed)
Labs stable.  Wbc still low.  Repeat cbcd 3 mo for stabilty B12 some better but still low-increase B 12 by 1030mcg/day(or get sublingual/dissolveable)

## 2023-07-27 NOTE — Patient Instructions (Signed)
It was very nice to see you today!  Look for date of tetanus Tdap.   PLEASE NOTE:  If you had any lab tests please let us know if you have not heard back within a few days. You may see your results on MyChart before we have a chance to review them but we will give you a call once they are reviewed by [place]. If we ordered any referrals today, please let us know if you have not heard from their office within the next week.   Please try these tips to maintain a healthy lifestyle:  Eat most of your calories during the day when you are active. Eliminate processed foods including packaged sweets (pies, cakes, cookies), reduce intake of potatoes, white bread, white pasta, and white rice. Look for whole grain options, oat flour or almond flour.  Each meal should contain half fruits/vegetables, one quarter protein, and one quarter carbs (no bigger than a computer mouse).  Cut down on sweet beverages. This includes juice, soda, and sweet tea. Also watch fruit intake, though this is a healthier sweet option, it still contains natural sugar! Limit to 3 servings daily.  Drink at least 1 glass of water with each meal and aim for at least 8 glasses per day  Exercise at least 150 minutes every week.

## 2023-07-27 NOTE — Progress Notes (Signed)
Phone: [phone]   Subjective:  Patient 27 y.o. male presenting for annual physical.  Chief Complaint  Patient presents with   Annual Exam    CPE Fasting Would like allergy medication sent to the pharmacy  Annual-exercises Allergies-sneezing a lot.  Taking zyrtec-only works for 1-2 hrs. Year round-some, but worse seasonally. Used nose sprays in past.  No asthma  See problem oriented charting- ROS- ROS: Gen: no fever, chills  Skin: no rash, itching ENT: no ear pain, ear drainage, +nasal congestion, rhinorrhea, Eyes: no blurry vision, double vision Resp: no, wheeze,SOB.  Occ cough CV: no CP, palpitations, LE edema,  GI: no heartburn, n/v//c, abd pain.  Some looser stools once/day.  Changing diet. GU: no dysuria, urgency, frequency, hematuria MSK: no joint pain, myalgias, back pain Neuro: no dizziness, headache, weakness, vertigo Psych: no depression, anxiety, insomnia, SI   The following were reviewed and entered/updated in epic: Past Medical History:  Diagnosis Date   Allergy    Eczema    Patient Active Problem List   Diagnosis Date Noted   Non-seasonal allergic rhinitis due to pollen 07/27/2023   Flexural eczema 05/15/2023   History reviewed. No pertinent surgical history.  Family History  Problem Relation Age of Onset   Migraines Mother    Asthma Brother     Medications- reviewed and updated Current Outpatient Medications  Medication Sig Dispense Refill   loratadine (CLARITIN) 10 MG tablet Take 1 tablet (10 mg total) by mouth daily. 90 tablet 3   montelukast (SINGULAIR) 10 MG tablet Take 1 tablet (10 mg total) by mouth at bedtime. 90 tablet 3   triamcinolone (NASACORT) 55 MCG/ACT AERO nasal inhaler Place 2 sprays into the nose daily. 3 each 3   triamcinolone cream (KENALOG) 0.1 % Apply 1 Application topically 2 (two) times daily. 30 g 1   No current facility-administered medications for this visit.    Allergies-reviewed and updated No Known  Allergies  Social History   Social History Narrative   engaged   Objective  Objective:  BP 118/66   Pulse 62   Temp 99.5 F (37.5 C) (Temporal)   Resp 18   Ht 6\' 2"  (1.88 m)   Wt 198 lb 8 oz (90 kg)   SpO2 97%   BMI 25.49 kg/m  Physical Exam  Gen: WDWN NAD HEENT: NCAT, conjunctiva not injected, sclera nonicteric TM WNL B, OP moist, no exudates   runny nose, enlarged turbinates NECK:  supple, no thyromegaly, no nodes, no carotid bruits CARDIAC: RRR, S1S2+, no murmur. DP 2+B LUNGS: CTAB. No wheezes ABDOMEN:  BS+, soft, NTND, No HSM, no masses EXT:  no edema MSK: no gross abnormalities. MS 5/5 all 4 NEURO: A&O x3.  CN II-XII intact.  PSYCH: normal mood. Good eye contact     Assessment and Plan   Health Maintenance counseling: 1. Anticipatory guidance: Patient counseled regarding regular dental exams q6 months, eye exams yearly, avoiding smoking and second hand smoke, limiting alcohol to 2 beverages per day.   2. Risk factor reduction:  Advised patient of need for regular exercise and diet rich in fruits and vegetables to reduce risk of heart attack and stroke. Exercise- +.   Wt Readings from Last 3 Encounters:  07/27/23 198 lb 8 oz (90 kg)  05/15/23 191 lb 9.6 oz (86.9 kg)  04/14/23 180 lb (81.6 kg)   3. Immunizations/screenings/ancillary studies Immunization History  Administered Date(s) Administered   DTaP 09/19/1996, 12/12/1996, 02/13/1997, 12/17/1997, 06/29/2000   Dtap, Unspecified 09/19/1996, 12/12/1996,  02/13/1997, 12/17/1997, 06/29/2000   HIB (PRP-OMP) 08/20/1996, 12/12/1996, 02/13/1997, 11/28/1997   HIB, Unspecified 08/20/1996, 12/12/1996, 02/13/1997, 11/28/1997   HPV Quadrivalent 11/14/2009, 01/22/2010, 05/27/2010   Hepatitis B Mar 25, 1996, 09/19/1996, 02/13/1997   IPV 09/19/1996, 12/12/1996, 02/13/1997, 06/29/2000   Influenza-Unspecified 07/22/2001, 08/23/2001, 07/09/2010, 05/28/2012, 08/03/2014   MMR 11/28/1997, 06/29/2000   Polio, Unspecified 09/19/1996,  12/12/1996, 02/13/1997, 06/29/2000   Varicella 11/28/1997   Health Maintenance Due  Topic Date Due   DTaP/Tdap/Td (6 - Tdap) 06/30/2007    4. Skin cancer screening- Iadvised regular sunscreen use. Denies worrisome, changing, or new skin lesions.  5. Smoking associated screening: non smoker  6. STD screening - not needed  Wellness examination -     Lipid panel -     Comprehensive metabolic panel -     CBC with Differential/Platelet -     Hemoglobin A1c -     TSH -     Vitamin B12  Non-seasonal allergic rhinitis due to pollen -     Montelukast Sodium; Take 1 tablet (10 mg total) by mouth at bedtime.  Dispense: 90 tablet; Refill: 3 -     Loratadine; Take 1 tablet (10 mg total) by mouth daily.  Dispense: 90 tablet; Refill: 3 -     Triamcinolone Acetonide; Place 2 sprays into the nose daily.  Dispense: 3 each; Refill: 3  Flexural eczema -     Triamcinolone Acetonide; Apply 1 Application topically 2 (two) times daily.  Dispense: 30 g; Refill: 1  Low serum vitamin B12 -     Vitamin B12   Wellness-anticipatory guidance.  Work on Diet/Exercise  Check CBC,CMP,lipids,TSH, A1C.  F/u 1 yr   Allergic rhinitis-chronic.  Not ocntrolled.  Trial singulair 10mg , loratadine 10mg  and nasacort(sens to the flonase smell).   Recommended follow up: Return in about 1 year (around 07/26/2024) for annual physical.  Lab/Order associations:+ fasting  Angelena Sole, MD

## 2023-07-29 ENCOUNTER — Other Ambulatory Visit: Payer: Self-pay | Admitting: *Deleted

## 2023-07-29 DIAGNOSIS — D729 Disorder of white blood cells, unspecified: Secondary | ICD-10-CM

## 2023-08-18 ENCOUNTER — Ambulatory Visit: Payer: Managed Care, Other (non HMO) | Admitting: Family Medicine

## 2023-10-17 ENCOUNTER — Telehealth: Payer: Medicaid Other | Admitting: Physician Assistant

## 2023-10-17 DIAGNOSIS — R399 Unspecified symptoms and signs involving the genitourinary system: Secondary | ICD-10-CM | POA: Diagnosis not present

## 2023-10-17 NOTE — Progress Notes (Signed)
Virtual Visit Consent   Adam Bernard, you are scheduled for a virtual visit with a [facility] provider today. Just as with appointments in the office, your consent must be obtained to participate. Your consent will be active for this visit and any virtual visit you may have with one of our providers in the next 365 days. If you have a MyChart account, a copy of this consent can be sent to you electronically.  As this is a virtual visit, video technology does not allow for your provider to perform a traditional examination. This may limit your provider's ability to fully assess your condition. If your provider identifies any concerns that need to be evaluated in person or the need to arrange testing (such as labs, EKG, etc.), we will make arrangements to do so. Although advances in technology are sophisticated, we cannot ensure that it will always work on either your end or our end. If the connection with a video visit is poor, the visit may have to be switched to a telephone visit. With either a video or telephone visit, we are not always able to ensure that we have a secure connection.  By engaging in this virtual visit, you consent to the provision of healthcare and authorize for your insurance to be billed (if applicable) for the services provided during this visit. Depending on your insurance coverage, you may receive a charge related to this service.  I need to obtain your verbal consent now. Are you willing to proceed with your visit today? Adam Bernard has provided verbal consent on 10/17/2023 for a virtual visit (video or telephone). Roney Jaffe, PA-C  Date: 10/17/2023 4:23 PM  Virtual Visit via Video Note   I, Adam Bernard, connected with  Adam Bernard  ([id number], 1996-09-19) on 10/17/23 at  4:15 PM EST by a video-enabled telemedicine application and verified that I am speaking with the correct person using two identifiers.  Location: Patient: Virtual Visit Location Patient:  Home Provider: Virtual Visit Location Provider: Home Office   I discussed the limitations of evaluation and management by telemedicine and the availability of in person appointments. The patient expressed understanding and agreed to proceed.    History of Present Illness: Adam Bernard is a 28 y.o. who identifies as a male who was assigned male at birth, and  reports experiencing cloudy urination, which has a noticeable odor unless he consumes an excessive amount of water. Accompanying these symptoms are intermittent abdominal and back pain, which alternate rather than occurring simultaneously. He also describes a tingling sensation following urination. These symptoms have been present for approximately two days.   Has not tried anything for relief.   Problems:  Patient Active Problem List   Diagnosis Date Noted   Non-seasonal allergic rhinitis due to pollen 07/27/2023   Flexural eczema 05/15/2023    Allergies: No Known Allergies Medications:  Current Outpatient Medications:    loratadine (CLARITIN) 10 MG tablet, Take 1 tablet (10 mg total) by mouth daily., Disp: 90 tablet, Rfl: 3   montelukast (SINGULAIR) 10 MG tablet, Take 1 tablet (10 mg total) by mouth at bedtime., Disp: 90 tablet, Rfl: 3   triamcinolone (NASACORT) 55 MCG/ACT AERO nasal inhaler, Place 2 sprays into the nose daily., Disp: 3 each, Rfl: 3   triamcinolone cream (KENALOG) 0.1 %, Apply 1 Application topically 2 (two) times daily., Disp: 30 g, Rfl: 1  Observations/Objective: Patient is well-developed, well-nourished in no acute distress.  Resting comfortably  at  home.  Head is normocephalic, atraumatic.  No labored breathing.  Speech is clear and coherent with logical content.  Patient is alert and oriented at baseline.    Assessment and Plan: 1. Urinary symptom or sign (Primary)  Reports of cloudy urine, abdominal and back pain, and post-urination tingling for the past two days. Differential diagnosis includes  urinary tract infection, prostatitis, or sexually transmitted infection. -Recommended in-person evaluation at an urgent care center for urine testing. -Provided information on local urgent care centers via MyChart..  Follow Up Instructions: I discussed the assessment and treatment plan with the patient. The patient was provided an opportunity to ask questions and all were answered. The patient agreed with the plan and demonstrated an understanding of the instructions.  A copy of instructions were sent to the patient via MyChart unless otherwise noted below.    The patient was advised to call back or seek an in-person evaluation if the symptoms worsen or if the condition fails to improve as anticipated.    Kasandra Knudsen Mayers, PA-C

## 2023-10-17 NOTE — Patient Instructions (Signed)
  Adam Bernard, thank you for joining Roney Jaffe, PA-C for today's virtual visit.  While this provider is not your primary care provider (PCP), if your PCP is located in our provider database this encounter information will be shared with them immediately following your visit.   A [facility] MyChart account gives you access to today's visit and all your visits, tests, and labs performed at [facility] " click here if you don't have a [facility] MyChart account or go to [email or web address]  Consent: (Patient) Adam Bernard provided verbal consent for this virtual visit at the beginning of the encounter.  Current Medications:  Current Outpatient Medications:    loratadine (CLARITIN) 10 MG tablet, Take 1 tablet (10 mg total) by mouth daily., Disp: 90 tablet, Rfl: 3   montelukast (SINGULAIR) 10 MG tablet, Take 1 tablet (10 mg total) by mouth at bedtime., Disp: 90 tablet, Rfl: 3   triamcinolone (NASACORT) 55 MCG/ACT AERO nasal inhaler, Place 2 sprays into the nose daily., Disp: 3 each, Rfl: 3   triamcinolone cream (KENALOG) 0.1 %, Apply 1 Application topically 2 (two) times daily., Disp: 30 g, Rfl: 1   Medications ordered in this encounter:  No orders of the defined types were placed in this encounter.    *If you need refills on other medications prior to your next appointment, please contact your pharmacy*  Follow-Up: Call back or seek an in-person evaluation if the symptoms worsen or if the condition fails to improve as anticipated.  [facility] [phone]  Other Instructions You should be seen in person for testing   If you have been instructed to have an in-person evaluation today at a local Urgent Care facility, please use the link below. It will take you to a list of all of our available [facility], including address, phone number and hours of operation. Please do not delay care.  [facility]  If you or a family  member do not have a primary care provider, use the link below to schedule a visit and establish care. When you choose a [facility] primary care physician or advanced practice provider, you gain a long-term partner in health. Find a Primary Care Provider  Learn more about [facility]'s in-office and virtual care options:  - Get Care Now

## 2023-10-18 ENCOUNTER — Ambulatory Visit
Admission: RE | Admit: 2023-10-18 | Discharge: 2023-10-18 | Disposition: A | Discharge status: Home / Self Care | Payer: Medicaid Other | Source: Ambulatory Visit | Attending: Internal Medicine | Admitting: Internal Medicine

## 2023-10-18 VITALS — BP 116/75 | HR 75 | Temp 97.9°F | Resp 16

## 2023-10-18 DIAGNOSIS — R350 Frequency of micturition: Secondary | ICD-10-CM | POA: Insufficient documentation

## 2023-10-18 DIAGNOSIS — N399 Disorder of urinary system, unspecified: Secondary | ICD-10-CM

## 2023-10-18 LAB — POCT URINALYSIS DIP (MANUAL ENTRY)
Bilirubin, UA: NEGATIVE
Blood, UA: NEGATIVE
Glucose, UA: NEGATIVE mg/dL
Ketones, POC UA: NEGATIVE mg/dL
Leukocytes, UA: NEGATIVE
Nitrite, UA: NEGATIVE
Protein Ur, POC: NEGATIVE mg/dL
Spec Grav, UA: >=1.03 — AB (ref 1.010–1.025)
Urobilinogen, UA: 0.2 U/dL
pH, UA: 6 (ref 5.0–8.0)

## 2023-10-18 NOTE — ED Provider Notes (Signed)
[facility]    CSN: [id number] Arrival date & time: 10/18/23  0836      History   Chief Complaint Chief Complaint  Patient presents with   Urinary Frequency    Think I have a bladder infection tingling after urination - Entered by patient    HPI Adam Bernard is a 28 y.o. male.   Patient presents to urgent care for evaluation of terminal urinary tingling sensation and urinary frequency that started a few days ago.  He additionally reports intermittent generalized abdominal discomfort and lower back discomfort.  Denies nausea, vomiting, fever, chills, diarrhea, headaches, dizziness, dysuria, gross hematuria, penile discharge, and concern for STD.  He is in a monogamous relationship with his male partner.  No recent antibiotic or steroid use.  He does not take an SGLT2 inhibitor and denies history of frequent urinary tract infections.  He has been seen by urology in the past for varicocele/hydrocele (6 months ago).  Denies changes in urinary stream but does report some increased bladder/urinary pressure at the end of his urinary stream.  He has not attempted use of any over-the-counter medications to help with symptoms PTA.   Urinary Frequency    Past Medical History:  Diagnosis Date   Allergy    Eczema     Patient Active Problem List   Diagnosis Date Noted   Non-seasonal allergic rhinitis due to pollen 07/27/2023   Flexural eczema 05/15/2023    History reviewed. No pertinent surgical history.     Home Medications    Prior to Admission medications   Medication Sig Start Date End Date Taking? Authorizing Provider  loratadine (CLARITIN) 10 MG tablet Take 1 tablet (10 mg total) by mouth daily. 07/27/23   Jeani Sow, MD  montelukast (SINGULAIR) 10 MG tablet Take 1 tablet (10 mg total) by mouth at bedtime. 07/27/23   Jeani Sow, MD  triamcinolone (NASACORT) 55 MCG/ACT AERO nasal inhaler Place 2 sprays into the nose daily. 07/27/23   Jeani Sow, MD  triamcinolone cream (KENALOG) 0.1 % Apply 1 Application topically 2 (two) times daily. 07/27/23   Jeani Sow, MD    Family History Family History  Problem Relation Age of Onset   Migraines Mother    Asthma Brother     Social History Social History   Tobacco Use   Smoking status: Never   Smokeless tobacco: Never  Vaping Use   Vaping status: Former  Substance Use Topics   Alcohol use: Yes    Alcohol/week: 7.0 standard drinks of alcohol    Types: 4 Glasses of wine, 3 Shots of liquor per week    Comment: 1-2/day wine   Drug use: Yes    Types: Marijuana     Allergies   Patient has no known allergies.   Review of Systems Review of Systems  Genitourinary:  Positive for frequency.  Per HPI   Physical Exam Triage Vital Signs ED Triage Vitals  Encounter Vitals Group     BP 10/18/23 0841 116/75     Systolic BP Percentile --      Diastolic BP Percentile --      Pulse Rate 10/18/23 0841 75     Resp 10/18/23 0841 16     Temp 10/18/23 0841 97.9 F (36.6 C)     Temp Source 10/18/23 0841 Oral     SpO2 10/18/23 0841 97 %     Weight --      Height --  Head Circumference --      Peak Flow --      Pain Score 10/18/23 0845 0     Pain Loc --      Pain Education --      Exclude from Growth Chart --    No data found.  Updated Vital Signs BP 116/75 (BP Location: Right Arm)   Pulse 75   Temp 97.9 F (36.6 C) (Oral)   Resp 16   SpO2 97%   Visual Acuity Right Eye Distance:   Left Eye Distance:   Bilateral Distance:    Right Eye Near:   Left Eye Near:    Bilateral Near:     Physical Exam Vitals and nursing note reviewed.  Constitutional:      Appearance: He is not ill-appearing or toxic-appearing.  HENT:     Head: Normocephalic and atraumatic.     Right Ear: Hearing and external ear normal.     Left Ear: Hearing and external ear normal.     Nose: Nose normal.     Mouth/Throat:     Lips: Pink.  Eyes:     General: Lids are normal.  Vision grossly intact. Gaze aligned appropriately.     Extraocular Movements: Extraocular movements intact.     Conjunctiva/sclera: Conjunctivae normal.  Pulmonary:     Effort: Pulmonary effort is normal.  Abdominal:     General: Bowel sounds are normal.     Palpations: Abdomen is soft.     Tenderness: There is no abdominal tenderness. There is no right CVA tenderness, left CVA tenderness or guarding.  Musculoskeletal:     Cervical back: Neck supple.  Skin:    General: Skin is warm and dry.     Capillary Refill: Capillary refill takes less than 2 seconds.     Findings: No rash.  Neurological:     General: No focal deficit present.     Mental Status: He is alert and oriented to person, place, and time. Mental status is at baseline.     Cranial Nerves: No dysarthria or facial asymmetry.  Psychiatric:        Mood and Affect: Mood normal.        Speech: Speech normal.        Behavior: Behavior normal.        Thought Content: Thought content normal.        Judgment: Judgment normal.      UC Treatments / Results  Labs (all labs ordered are listed, but only abnormal results are displayed) Labs Reviewed  POCT URINALYSIS DIP (MANUAL ENTRY) - Abnormal; Notable for the following components:      Result Value   Spec Grav, UA >=1.030 (*)    All other components within normal limits  CYTOLOGY, (ORAL, ANAL, URETHRAL) ANCILLARY ONLY    EKG   Radiology No results found.  Procedures Procedures (including critical care time)  Medications Ordered in UC Medications - No data to display  Initial Impression / Assessment and Plan / UC Course  I have reviewed the triage vital signs and the nursing notes.  Pertinent labs & imaging results that were available during my care of the patient were reviewed by me and considered in my medical decision making (see chart for details).   1.  Urinary problem mL Urinalysis is unremarkable for signs of urinary tract infection. Cytology swab is  pending and will come back in the next 2 to 3 days, we will call patient if swab is positive  for any sexually transmitted infections, though I have low suspicion for STD.  Specific gravity is elevated indicating dehydration.  He does not appear to be dehydrated on physical exam. Discussed increase water intake to promote hydration and reduce intake of urinary irritants. May follow-up with PCP/urologist as needed.  Counseled patient on potential for adverse effects with medications prescribed/recommended today, strict ER and return-to-clinic precautions discussed, patient verbalized understanding.    Final Clinical Impressions(s) / UC Diagnoses   Final diagnoses:  Urinary problem in male     Discharge Instructions      Your urinalysis is negative for signs of urinary tract infection. Urine shows dehydration. I would like for you to increase your water intake to at least 64 ounces of water per day. Avoid drinking beverages like sodas, tea, coffee, etc. as these beverages are noted to irritate the urinary tract and cause urinary symptoms/dehydration.  The penile swab is pending and will come back in the next 2 to 3 days. You will be able to see these results on MyChart as well.  Follow-up with your primary care and/or urologist as needed.  If you develop any new or worsening symptoms or if your symptoms do not start to improve, please return here or follow-up with your primary care provider. If your symptoms are severe, please go to the emergency room.     ED Prescriptions   None    PDMP not reviewed this encounter.   Carlisle Beers, [place] 10/18/23 [phone]

## 2023-10-18 NOTE — ED Triage Notes (Signed)
Pt c/o urinary odor, tingling after urinating and lower back pain for 2 days.

## 2023-10-18 NOTE — Discharge Instructions (Signed)
Your urinalysis is negative for signs of urinary tract infection. Urine shows dehydration. I would like for you to increase your water intake to at least 64 ounces of water per day. Avoid drinking beverages like sodas, tea, coffee, etc. as these beverages are noted to irritate the urinary tract and cause urinary symptoms/dehydration.  The penile swab is pending and will come back in the next 2 to 3 days. You will be able to see these results on MyChart as well.  Follow-up with your primary care and/or urologist as needed.  If you develop any new or worsening symptoms or if your symptoms do not start to improve, please return here or follow-up with your primary care provider. If your symptoms are severe, please go to the emergency room.

## 2023-10-19 LAB — CYTOLOGY, (ORAL, ANAL, URETHRAL) ANCILLARY ONLY
Chlamydia: NEGATIVE
Comment: NEGATIVE
Comment: NEGATIVE
Comment: NORMAL
Neisseria Gonorrhea: NEGATIVE
Trichomonas: NEGATIVE

## 2023-11-03 ENCOUNTER — Ambulatory Visit: Payer: Medicaid Other | Admitting: Family Medicine

## 2023-11-03 VITALS — BP 120/68 | HR 83 | Temp 98.1°F | Ht 74.0 in | Wt 198.0 lb

## 2023-11-03 DIAGNOSIS — M79644 Pain in right finger(s): Secondary | ICD-10-CM | POA: Diagnosis not present

## 2023-11-03 MED ORDER — IBUPROFEN 600 MG PO TABS: 600.0000 mg | ORAL_TABLET | Freq: Three times a day (TID) | ORAL | 1 refills | Status: DC | PRN

## 2023-11-03 NOTE — Patient Instructions (Signed)
Stretches, try to not let dangle.  Take the ibuprofen.  Can ice it as well.  Worse, new changes, or not better in 2 wks, will get x-ray

## 2023-11-03 NOTE — Progress Notes (Signed)
 Subjective:     Patient ID: Adam Bernard, male    DOB: 1996/07/26, 28 y.o.   MRN: [id number]  Chief Complaint  Patient presents with   Hand Pain    Right middle finger pain for couple days, took Ibuprofen , Ice and it did help little.     HPI Discussed the use of AI scribe software for clinical note transcription with the patient, who gave verbal consent to proceed.  History of Present Illness   Adam Bernard is a 28 year old male who presents with swelling and stiffness of the right middle finger.  He has been experiencing swelling and stiffness in his right middle finger, which began a few days ago upon waking. The swelling is localized at the proximal interphalangeal (PIP) joint, and he describes the finger as 'real tight and stiff.' He experiences pain when attempting to bend the finger and is unable to bend it fully. No known trauma, such as jamming or bending the finger, and he is unsure of how the injury occurred. Ibuprofen  has provided minimal relief. No redness, warmth, or tenderness is present, and there is no history of gout. No fever, chills, or insect bites.  He has a history of eczema, which flares up intermittently. He has been using his wife's triamcinolone  ointment, which he finds effective.       Health Maintenance Due  Topic Date Due   DTaP/Tdap/Td (6 - Tdap) 06/30/2007   COVID-19 Vaccine (1 - 2024-25 season) Never done    Past Medical History:  Diagnosis Date   Allergy    Eczema     History reviewed. No pertinent surgical history.   Current Outpatient Medications:    ibuprofen  (ADVIL ) 600 MG tablet, Take 1 tablet (600 mg total) by mouth every 8 (eight) hours as needed., Disp: 30 tablet, Rfl: 1   loratadine  (CLARITIN ) 10 MG tablet, Take 1 tablet (10 mg total) by mouth daily., Disp: 90 tablet, Rfl: 3   montelukast  (SINGULAIR ) 10 MG tablet, Take 1 tablet (10 mg total) by mouth at bedtime., Disp: 90 tablet, Rfl: 3   triamcinolone  (NASACORT ) 55 MCG/ACT AERO  nasal inhaler, Place 2 sprays into the nose daily., Disp: 3 each, Rfl: 3   triamcinolone  cream (KENALOG ) 0.1 %, Apply 1 Application topically 2 (two) times daily., Disp: 30 g, Rfl: 1  No Known Allergies ROS neg/noncontributory except as noted HPI/below      Objective:     BP 120/68 (BP Location: Right Arm, Patient Position: Sitting, Cuff Size: Normal)   Pulse 83   Temp 98.1 F (36.7 C)   Ht 6' 2 (1.88 m)   Wt 198 lb (89.8 kg)   SpO2 98%   BMI 25.42 kg/m  Wt Readings from Last 3 Encounters:  11/03/23 198 lb (89.8 kg)  07/27/23 198 lb 8 oz (90 kg)  05/15/23 191 lb 9.6 oz (86.9 kg)    Physical Exam   Gen: WDWN NAD HEENT: NCAT, conjunctiva not injected, sclera nonicteric MSK: R middle finger-swelling and minimal tenderness at PIP.  Not red nor warm.  Some stiffness w/flexion.SABRA  NEURO: A&O x3.  CN II-XII intact.  PSYCH: normal mood. Good eye contact     Assessment & Plan:  Pain of right middle finger  Other orders -     Ibuprofen ; Take 1 tablet (600 mg total) by mouth every 8 (eight) hours as needed.  Dispense: 30 tablet; Refill: 1  Assessment and Plan    Right Middle Finger Swelling and  Stiffness   The right middle finger exhibits acute swelling and stiffness at the PIP joint without trauma, fever, chills, or bites, and no signs of infection. Differential diagnosis includes minor trauma or strain, possibly during sleep. Ibuprofen  has provided minimal relief. Discussed risks of untreated symptoms and potential need for further intervention. Elevate the affected finger and prescribe ibuprofen  TID. Advise against pushing past the pain threshold. Monitor for signs of infection and return if symptoms worsen or persist in 2 weeks. Consider an x-ray if symptoms persist or worsen.  Eczema   Intermittent eczema flares have been managed with spouse's triamcinolone  ointment. Emphasized the importance of using his own prescribed medication to ensure proper dosage and avoid issues with  sharing medications. Pick up triamcinolone  cream prescription from [facility] on [address] and use as needed for flares.  General Health Maintenance   Weight is stabilized with no new health issues reported. Encourage maintaining current weight and muscle-building activities.        Return if symptoms worsen or fail to improve.  Jenkins CHRISTELLA Carrel, MD

## 2024-02-19 ENCOUNTER — Ambulatory Visit: Admitting: Family Medicine

## 2024-02-23 ENCOUNTER — Ambulatory Visit (INDEPENDENT_AMBULATORY_CARE_PROVIDER_SITE_OTHER): Admitting: Family Medicine

## 2024-02-23 ENCOUNTER — Encounter: Payer: Self-pay | Admitting: Family Medicine

## 2024-02-23 VITALS — BP 123/69 | HR 69 | Temp 98.6°F | Resp 18 | Ht 74.0 in | Wt 206.5 lb

## 2024-02-23 DIAGNOSIS — R1031 Right lower quadrant pain: Secondary | ICD-10-CM

## 2024-02-23 DIAGNOSIS — G8929 Other chronic pain: Secondary | ICD-10-CM

## 2024-02-23 NOTE — Patient Instructions (Addendum)
 [facility] at [facility]  [address] on the 1st floor Phone number [phone]   Jude Norton daily  [phone] other studies

## 2024-02-23 NOTE — Progress Notes (Signed)
 Subjective:     Patient ID: Adam Bernard, male    DOB: June 15, 1996, 28 y.o.   MRN: [id number]  Chief Complaint  Patient presents with   Abdominal Pain    Started a while ago, pain never went away, has some issues with constipation   Wrist Pain    Left wrist pain and swelling, has a bump that popped up 3 days ago   Headache    Started over 1 week ago    HPI Discussed the use of AI scribe software for clinical note transcription with the patient, who gave verbal consent to proceed.  History of Present Illness Adam Bernard is a 28 year old male who presents with left wrist pain and abdominal discomfort.  He has been experiencing left wrist pain that began spontaneously two to three days ago. The pain was severe initially but subsided significantly by the day before the visit. He denies any specific injury or unusual activity that could have caused the pain, although he regularly lifts weights. He mentions a lump on the wrist, which was painful initially but is not currently causing discomfort. He has been managing the pain with ice and ibuprofen .  He reports tingling in his right middle finger, which he suspects might be related to arthritis. The tingling is intermittent and has been present for some time. He uses Voltaren  cream to manage the symptoms, which provides some relief.  He describes abdominal discomfort located on the right side below the umbilicus, which he has been experiencing for a while. The discomfort is associated with changes in bowel habits, including constipation and a sensation of bloating. He has used a laxative, magnesium citrate, to relieve constipation, which resulted in temporary relief but did not resolve the abdominal discomfort. The pain is more noticeable when lying down in certain positions. No nausea, vomiting, fever, chills, or dysuria, but he mentions a tingling sensation post-urination-chronic and has seen urol. A CT scan performed last April without IV  contrast showed no obstructions, and he has not had any abdominal surgeries.  He maintains a diet with adequate fiber intake, including salads, vegetables, and fruits, and drinks at least 64 ounces of water daily. He exercises five times a week. He is not aware of any family history of similar issues, although he mentions that his cousin has some health problems.    There are no preventive care reminders to display for this patient.  Past Medical History:  Diagnosis Date   Allergy    Eczema     History reviewed. No pertinent surgical history.   Current Outpatient Medications:    ibuprofen  (ADVIL ) 600 MG tablet, Take 1 tablet (600 mg total) by mouth every 8 (eight) hours as needed., Disp: 30 tablet, Rfl: 1   loratadine  (CLARITIN ) 10 MG tablet, Take 1 tablet (10 mg total) by mouth daily. (Patient not taking: Reported on 02/23/2024), Disp: 90 tablet, Rfl: 3   montelukast  (SINGULAIR ) 10 MG tablet, Take 1 tablet (10 mg total) by mouth at bedtime. (Patient not taking: Reported on 02/23/2024), Disp: 90 tablet, Rfl: 3   triamcinolone  (NASACORT ) 55 MCG/ACT AERO nasal inhaler, Place 2 sprays into the nose daily. (Patient not taking: Reported on 02/23/2024), Disp: 3 each, Rfl: 3   triamcinolone  cream (KENALOG ) 0.1 %, Apply 1 Application topically 2 (two) times daily. (Patient not taking: Reported on 02/23/2024), Disp: 30 g, Rfl: 1  No Known Allergies ROS neg/noncontributory except as noted HPI/below      Objective:  BP 123/69   Pulse 69   Temp 98.6 F (37 C) (Temporal)   Resp 18   Ht 6\' 2"  (1.88 m)   Wt 206 lb 8 oz (93.7 kg)   SpO2 97%   BMI 26.51 kg/m  Wt Readings from Last 3 Encounters:  02/23/24 206 lb 8 oz (93.7 kg)  11/03/23 198 lb (89.8 kg)  07/27/23 198 lb 8 oz (90 kg)    Physical Exam   Gen: WDWN NAD HEENT: NCAT, conjunctiva not injected, sclera nonicteric NECK:  supple, no thyromegaly, no nodes, no carotid bruits CARDIAC: RRR, S1S2+, no murmur. DP 2+B LUNGS: CTAB.  No wheezes ABDOMEN:  BS+, soft, mod tender RLQ closer to umbilicus and firmness there. No HSM, no masses EXT:  no edema MSK: no gross abnormalities.  NEURO: A&O x3.  CN II-XII intact.  PSYCH: normal mood. Good eye contact  NT ganglion cyst L wrist     Assessment & Plan:  Chronic RLQ pain  Right lower quadrant abdominal pain -     CT ABDOMEN PELVIS W CONTRAST; Future  Assessment and Plan Assessment & Plan Abdominal pain with constipation   He experiences chronic right lower quadrant abdominal pain with constipation and altered stool caliber. A previous CT scan without IV contrast showed no obstructions. The pain is intermittent and position-dependent, with no fever, chills, or significant urinary symptoms. The differential diagnosis includes possible scar tissue or muscular issues, mass, IBS, other. He used magnesium citrate for constipation relief but reports persistent symptoms. Add Benefiber daily to improve bowel regularity. Order a CT scan with contrast to further evaluate the abdominal pain. Consider referral to gastroenterology if the CT scan is negative and symptoms persist.  Ganglion cyst of left wrist   He has a ganglion cyst on the left wrist, initially painful but now subsided. There is no recent trauma or unusual activity. He uses ice and ibuprofen  for pain management and is advised against self-treatment methods like smashing or poking the cyst. Continue icing and ibuprofen  as needed for pain management. Provide contact information for sports medicine for further evaluation if pain persists, considering potential drainage or surgical intervention if conservative measures fail.    Return if symptoms worsen or fail to improve.  Ellsworth Haas, MD

## 2024-03-01 ENCOUNTER — Inpatient Hospital Stay: Admission: RE | Admit: 2024-03-01 | Source: Ambulatory Visit

## 2024-03-03 ENCOUNTER — Telehealth: Payer: Self-pay | Admitting: Family Medicine

## 2024-03-03 NOTE — Telephone Encounter (Signed)
 Copied from CRM [id number]. Topic: General - Other >> Mar 03, 2024  9:57 AM Allyne Areola wrote: Reason for CRM: Soyla Duverney [phone] ext [phone] from [facility] [employer] is calling to verify if the CT Abdomen Pelvis W Contrast requires a prior authorization.

## 2024-04-11 ENCOUNTER — Ambulatory Visit
Admission: RE | Admit: 2024-04-11 | Discharge: 2024-04-11 | Disposition: A | Discharge status: Home / Self Care | Source: Ambulatory Visit | Attending: Family Medicine | Admitting: Family Medicine

## 2024-04-11 ENCOUNTER — Ambulatory Visit: Payer: Self-pay | Admitting: Family Medicine

## 2024-04-11 DIAGNOSIS — R1031 Right lower quadrant pain: Secondary | ICD-10-CM

## 2024-04-11 DIAGNOSIS — R194 Change in bowel habit: Secondary | ICD-10-CM | POA: Diagnosis not present

## 2024-04-11 MED ORDER — IOPAMIDOL (ISOVUE-300) INJECTION 61%
100.0000 mL | Freq: Once | INTRAVENOUS | Status: AC | PRN
  Administered 2024-04-11: 100 mL via INTRAVENOUS

## 2024-04-11 NOTE — Progress Notes (Signed)
 Nothing acute.   Nonspecific stasis of material within ileal loops in the pelvis. This can be seen enteritis     not sure what to make of this-refer to GI

## 2024-04-12 ENCOUNTER — Other Ambulatory Visit: Payer: Self-pay | Admitting: *Deleted

## 2024-04-12 DIAGNOSIS — R1031 Right lower quadrant pain: Secondary | ICD-10-CM

## 2024-06-02 ENCOUNTER — Encounter: Payer: Self-pay | Admitting: Family Medicine

## 2024-06-06 ENCOUNTER — Ambulatory Visit

## 2024-06-07 ENCOUNTER — Encounter: Payer: Self-pay | Admitting: Family Medicine

## 2024-06-07 ENCOUNTER — Ambulatory Visit (INDEPENDENT_AMBULATORY_CARE_PROVIDER_SITE_OTHER): Admitting: Family Medicine

## 2024-06-07 ENCOUNTER — Ambulatory Visit: Payer: Self-pay | Admitting: Family Medicine

## 2024-06-07 VITALS — BP 125/71 | HR 64 | Temp 97.5°F | Resp 18 | Ht 74.0 in | Wt 194.0 lb

## 2024-06-07 DIAGNOSIS — R1031 Right lower quadrant pain: Secondary | ICD-10-CM | POA: Diagnosis not present

## 2024-06-07 LAB — CBC WITH DIFFERENTIAL/PLATELET
Basophils Absolute: 0 K/uL (ref 0.0–0.1)
Basophils Relative: 0.9 % (ref 0.0–3.0)
Eosinophils Absolute: 0.1 K/uL (ref 0.0–0.7)
Eosinophils Relative: 3.6 % (ref 0.0–5.0)
HCT: 44.4 % (ref 39.0–52.0)
Hemoglobin: 14.9 g/dL (ref 13.0–17.0)
Lymphocytes Relative: 52 % — ABNORMAL HIGH (ref 12.0–46.0)
Lymphs Abs: 1.8 K/uL (ref 0.7–4.0)
MCHC: 33.5 g/dL (ref 30.0–36.0)
MCV: 101.4 fl — ABNORMAL HIGH (ref 78.0–100.0)
Monocytes Absolute: 0.4 K/uL (ref 0.1–1.0)
Monocytes Relative: 10.7 % (ref 3.0–12.0)
Neutro Abs: 1.2 K/uL — ABNORMAL LOW (ref 1.4–7.7)
Neutrophils Relative %: 32.8 % — ABNORMAL LOW (ref 43.0–77.0)
Platelets: 178 K/uL (ref 150.0–400.0)
RBC: 4.38 Mil/uL (ref 4.22–5.81)
RDW: 12.6 % (ref 11.5–15.5)
WBC: 3.5 K/uL — ABNORMAL LOW (ref 4.0–10.5)

## 2024-06-07 LAB — COMPREHENSIVE METABOLIC PANEL WITH GFR
ALT: 14 U/L (ref 0–53)
AST: 16 U/L (ref 0–37)
Albumin: 4.1 g/dL (ref 3.5–5.2)
Alkaline Phosphatase: 48 U/L (ref 39–117)
BUN: 18 mg/dL (ref 6–23)
CO2: 29 meq/L (ref 19–32)
Calcium: 9.4 mg/dL (ref 8.4–10.5)
Chloride: 104 meq/L (ref 96–112)
Creatinine, Ser: 1.15 mg/dL (ref 0.40–1.50)
GFR: 86.96 mL/min (ref >60.00)
Glucose, Bld: 87 mg/dL (ref 70–99)
Potassium: 4.2 meq/L (ref 3.5–5.1)
Sodium: 138 meq/L (ref 135–145)
Total Bilirubin: 0.6 mg/dL (ref 0.2–1.2)
Total Protein: 7 g/dL (ref 6.0–8.3)

## 2024-06-07 LAB — SEDIMENTATION RATE: Sed Rate: 2 mm/h (ref 0–15)

## 2024-06-07 LAB — C-REACTIVE PROTEIN: CRP: <1 mg/dL (ref 0.5–20.0)

## 2024-06-07 NOTE — Progress Notes (Signed)
 Subjective:     Patient ID: Adam Bernard, male    DOB: 24-May-1996, 28 y.o.   MRN: [id number]  Chief Complaint  Patient presents with   Abdominal Pain    Right lower abdominal pain, getting worse, feels like a knot is in the area, possible appendix issue    HPI Discussed the use of AI scribe software for clinical note transcription with the patient, who gave verbal consent to proceed.  History of Present Illness Adam Bernard is a 28 year old male who presents with ongoing right lower quadrant abdominal pain.  He has been experiencing persistent right lower quadrant abdominal pain that has been worsening over time. The pain is not associated with specific activities but is accompanied by bloating after eating. He describes the sensation as a 'knot and swollen pressure' in the area. He has been taking vitamins for gut health and previously used magnesium citrate to relieve constipation, which did not alleviate the pain.  He has undergone two CT scans, both of which did not show any signs of appendicitis. He was referred to a gastroenterologist but did not receive the communication due to a full mailbox and has not yet followed up with him. SABRA  He experiences frequent stomach noises and describes his abdomen as feeling firm in the area of discomfort.  He is not currently taking any allergy medications, including montelukast  or loratadine , as he feels his allergies have been manageable without them. His allergies tend to flare up seasonally.    Health Maintenance Due  Topic Date Due   Influenza Vaccine  04/29/2024    Past Medical History:  Diagnosis Date   Allergy    Eczema     History reviewed. No pertinent surgical history.   Current Outpatient Medications:    montelukast  (SINGULAIR ) 10 MG tablet, Take 1 tablet (10 mg total) by mouth at bedtime. (Patient not taking: Reported on 06/07/2024), Disp: 90 tablet, Rfl: 3   triamcinolone  (NASACORT ) 55 MCG/ACT AERO nasal inhaler,  Place 2 sprays into the nose daily. (Patient not taking: Reported on 06/07/2024), Disp: 3 each, Rfl: 3   triamcinolone  cream (KENALOG ) 0.1 %, Apply 1 Application topically 2 (two) times daily. (Patient not taking: Reported on 06/07/2024), Disp: 30 g, Rfl: 1  Allergies  Allergen Reactions   Iodinated Contrast Media Itching    Pt aware will need 13 hr prep in the future 04/11/24   ROS neg/noncontributory except as noted HPI/below      Objective:     BP 125/71   Pulse 64   Temp (!) 97.5 F (36.4 C) (Temporal)   Resp 18   Ht 6' 2 (1.88 m)   Wt 194 lb (88 kg)   SpO2 99%   BMI 24.91 kg/m  Wt Readings from Last 3 Encounters:  06/07/24 194 lb (88 kg)  02/23/24 206 lb 8 oz (93.7 kg)  11/03/23 198 lb (89.8 kg)    Physical Exam   Gen: WDWN NAD HEENT: NCAT, conjunctiva not injected, sclera nonicteric CARDIAC: RRR, S1S2+, no murmur. ABDOMEN:  BS+, soft,mildly tender RLQ and ?fullness, No HSM,  EXT:  no edema MSK: no gross abnormalities.  NEURO: A&O x3.  CN II-XII intact.  PSYCH: normal mood. Good eye contact  CT 04/11/24 IMPRESSION: 1. No acute localizing process in the abdomen or pelvis. 2. Normal appendix. 3. Nonspecific stasis of material within ileal loops in the pelvis. This can be seen enteritis.     Assessment & Plan:  Right lower  quadrant abdominal pain -     CBC with Differential/Platelet -     Comprehensive metabolic panel with GFR -     Sedimentation rate -     C-reactive protein -     Ambulatory referral to General Surgery  Assessment and Plan Assessment & Plan Right lower quadrant abdominal pain with associated constipation and bloating   Chronic right lower quadrant abdominal pain with intermitt constipation and bloating has worsened over time. Previous CT scans did not show evidence of acute appendicitis but some poss enteritis. Differential diagnosis includes irritable bowel syndrome, small intestinal bacterial overgrowth, inflammatory bowel disease, and  possible appendicitis. Symptoms include bloating and a sensation of a knot in the right lower quadrant, with no palpable mass or hernia. A previous CT scan indicated enteritis, suggesting inflammation. He is concerned about potential appendicitis and the risk of an emergency. Order blood work to check for inflammatory markers and other relevant tests. Refer to gastroenterology for further evaluation of possible irritable bowel syndrome, small intestinal bacterial overgrowth, or inflammatory bowel disease. Refer to surgery for evaluation of possible appendicitis. Instruct him to contact gastroenterology and surgery if he does not receive a call for appointment scheduling.    Return if symptoms worsen or fail to improve.  Jenkins CHRISTELLA Carrel, MD

## 2024-06-07 NOTE — Patient Instructions (Signed)
 Call GI [phone]  [facility] [address]. [place], [place] [phone]

## 2024-06-07 NOTE — Progress Notes (Signed)
 Labs are stable.

## 2024-06-09 ENCOUNTER — Encounter: Payer: Self-pay | Admitting: Gastroenterology

## 2024-06-09 ENCOUNTER — Encounter: Payer: Self-pay | Admitting: *Deleted

## 2024-06-09 ENCOUNTER — Telehealth: Payer: Self-pay | Admitting: Family Medicine

## 2024-06-09 NOTE — Telephone Encounter (Signed)
 [facility] called and states their provider recommends the referral to be send to a GI specialist instead of their office. Please advise.

## 2024-06-09 NOTE — Telephone Encounter (Signed)
 Referral has already been placed to GI, they have been trying to contact patient to schedule. Patient notified.

## 2024-06-21 ENCOUNTER — Ambulatory Visit (INDEPENDENT_AMBULATORY_CARE_PROVIDER_SITE_OTHER): Admitting: Gastroenterology

## 2024-06-21 ENCOUNTER — Other Ambulatory Visit (INDEPENDENT_AMBULATORY_CARE_PROVIDER_SITE_OTHER)

## 2024-06-21 ENCOUNTER — Encounter: Payer: Self-pay | Admitting: Gastroenterology

## 2024-06-21 VITALS — BP 120/70 | HR 74 | Ht 75.0 in | Wt 190.0 lb

## 2024-06-21 DIAGNOSIS — K625 Hemorrhage of anus and rectum: Secondary | ICD-10-CM

## 2024-06-21 DIAGNOSIS — K5900 Constipation, unspecified: Secondary | ICD-10-CM | POA: Diagnosis not present

## 2024-06-21 DIAGNOSIS — R1031 Right lower quadrant pain: Secondary | ICD-10-CM | POA: Diagnosis not present

## 2024-06-21 DIAGNOSIS — R194 Change in bowel habit: Secondary | ICD-10-CM

## 2024-06-21 DIAGNOSIS — R140 Abdominal distension (gaseous): Secondary | ICD-10-CM

## 2024-06-21 MED ORDER — NA SULFATE-K SULFATE-MG SULF 17.5-3.13-1.6 GM/177ML PO SOLN: 1.0000 | ORAL | 0 refills | Status: DC

## 2024-06-21 NOTE — Progress Notes (Signed)
 Adam Bernard [id number] 1996/01/31   Chief Complaint: Abdominal pain  Referring Provider: Wendolyn Jenkins Jansky, MD Primary GI MD: Sampson  HPI: Adam Bernard is a 28 y.o. male with past medical history of allergies, eczema who presents today for a complaint of right lower quadrant pain.    Seen by PCP 06/07/2024 for complaint of right lower quadrant pain associated with constipation and bloating which had notably worsened over time.  Previous CT did show evidence of possible enteritis.  Referred to GI for further evaluation.  Referred to surgery for evaluation of possible appendicitis.  Labs 06/07/2024 showed WBC 3.5 (appears to be chronically low), hemoglobin 14.9, elevated MCV 101.4, normal CMP, negative inflammatory markers.   Patient states he has been having trouble with constipation and RLQ abdominal pain for about a year now.  Initially thought pain was due to gas but it has continually worsened.  Previously was having a bowel movement every morning without difficulty, but recently has been having decreased frequency of bowel movements, passing smaller amounts of stool, and having to strain with bowel movements.  With significant straining he notices a small amount of BRBPR on the toilet paper only.  Denies any rectal pain with bowel movements.  Sometimes will have sensation of fecal urgency but then be unable to pass any stool.  He denies any diet or medication changes prior to onset of symptoms.  Tries to eat a healthy diet with lean meats, plenty of fruits and vegetables, and drinks plenty of water.  Exercises 5 days a week.  Has been using smooth move tea as needed which does produce a bowel movement in about 10 hours but constipation always returns.  Did try OTC mag citrate as well which caused diarrhea.  Frequently feels bloated, but bloating does improve after a bowel movement.  He denies any nausea, vomiting, fever, chills.  Smokes marijuana on the weekends.  Denies cigarette  smoking.  Denies family history of colon cancer.  Previous GI Procedures/Imaging   CT A/P 04/11/2024 1. No acute localizing process in the abdomen or pelvis. 2. Normal appendix. 3. Nonspecific stasis of material within ileal loops in the pelvis. This can be seen enteritis.   Past Medical History:  Diagnosis Date   Allergy    Eczema     Past Surgical History:  Procedure Laterality Date   MOUTH SURGERY     wisdom teeth    Current Outpatient Medications  Medication Sig Dispense Refill   montelukast  (SINGULAIR ) 10 MG tablet Take 1 tablet (10 mg total) by mouth at bedtime. (Patient not taking: Reported on 06/21/2024) 90 tablet 3   triamcinolone  (NASACORT ) 55 MCG/ACT AERO nasal inhaler Place 2 sprays into the nose daily. (Patient not taking: Reported on 06/21/2024) 3 each 3   triamcinolone  cream (KENALOG ) 0.1 % Apply 1 Application topically 2 (two) times daily. (Patient not taking: Reported on 06/21/2024) 30 g 1   No current facility-administered medications for this visit.    Allergies as of 06/21/2024 - Review Complete 06/21/2024  Allergen Reaction Noted   Iodinated contrast media Itching 04/11/2024    Family History  Problem Relation Age of Onset   Migraines Mother    Asthma Brother    Colon cancer Neg Hx    Esophageal cancer Neg Hx     Social History   Tobacco Use   Smoking status: Never   Smokeless tobacco: Never  Vaping Use   Vaping status: Former  Substance Use Topics   Alcohol use:  Not Currently    Alcohol/week: 7.0 standard drinks of alcohol    Types: 4 Glasses of wine, 3 Shots of liquor per week    Comment: 1-2/day wine   Drug use: Yes    Types: Marijuana    Comment: on weekends     Review of Systems:    Constitutional: No fever, chills Cardiovascular: No chest pain Respiratory: No SOB  Gastrointestinal: See HPI and otherwise negative   Physical Exam:  Vital signs: BP 120/70   Pulse 74   Ht 6' 3 (1.905 m)   Wt 190 lb (86.2 kg)   BMI 23.75  kg/m   Wt Readings from Last 3 Encounters:  06/21/24 190 lb (86.2 kg)  06/07/24 194 lb (88 kg)  02/23/24 206 lb 8 oz (93.7 kg)     Constitutional: Pleasant, well-appearing male in NAD, alert and cooperative Head:  Normocephalic and atraumatic.  Eyes: No scleral icterus. Respiratory: Respirations even and unlabored. Lungs clear to auscultation bilaterally.  No wheezes, crackles, or rhonchi.  Cardiovascular:  Regular rate and rhythm. No murmurs. No peripheral edema. Gastrointestinal:  Soft, nondistended, mildly tender to palpation of RLQ. No rebound or guarding. Normal bowel sounds. No appreciable masses or hepatomegaly. Rectal:  Deferred to colonoscopy. Neurologic:  Alert and oriented x4;  grossly normal neurologically.  Skin:   Dry and intact without significant lesions or rashes. Psychiatric: Oriented to person, place and time. Demonstrates good judgement and reason without abnormal affect or behaviors.   RELEVANT LABS AND IMAGING: CBC    Component Value Date/Time   WBC 3.5 (L) 06/07/2024 1437   RBC 4.38 06/07/2024 1437   HGB 14.9 06/07/2024 1437   HCT 44.4 06/07/2024 1437   PLT 178.0 06/07/2024 1437   MCV 101.4 (H) 06/07/2024 1437   MCHC 33.5 06/07/2024 1437   RDW 12.6 06/07/2024 1437   LYMPHSABS 1.8 06/07/2024 1437   MONOABS 0.4 06/07/2024 1437   EOSABS 0.1 06/07/2024 1437   BASOSABS 0.0 06/07/2024 1437    CMP     Component Value Date/Time   NA 138 06/07/2024 1437   K 4.2 06/07/2024 1437   CL 104 06/07/2024 1437   CO2 29 06/07/2024 1437   GLUCOSE 87 06/07/2024 1437   BUN 18 06/07/2024 1437   CREATININE 1.15 06/07/2024 1437   CALCIUM 9.4 06/07/2024 1437   PROT 7.0 06/07/2024 1437   ALBUMIN 4.1 06/07/2024 1437   AST 16 06/07/2024 1437   ALT 14 06/07/2024 1437   ALKPHOS 48 06/07/2024 1437   BILITOT 0.6 06/07/2024 1437     Assessment/Plan:   Change in bowel habits Constipation Abdominal bloating Rectal bleeding RLQ abdominal pain Patient seen today for  evaluation of worsening constipation, bloating, and RLQ abdominal pain which has been ongoing for about a year now.  CT A/P done in July did show normal appendix and nonspecific stasis of material within ileal loops in the pelvis which could be seen with enteritis. Previously was having a normal bowel movement every morning without difficulty, now having decreased frequency of bowel movements, increased straining, sometimes with associated BRBPR, and abdominal bloating.  RLQ pain present most of the time.  Has been using some OTC laxatives as needed which helps some but symptoms persist. Recent labs showed normal hemoglobin, elevated MCV (he is on a B12 supplement) normal CMP, negative inflammatory markers. Will plan for colonoscopy to rule out underlying inflammatory bowel disease or malignancy, and to evaluate the terminal ileum.  - Schedule colonoscopy. I thoroughly discussed the procedure  with the patient to include nature of the procedure, alternatives, benefits, and risks (including but not limited to bleeding, infection, perforation, anesthesia/cardiac/pulmonary complications). Patient verbalized understanding and gave verbal consent to proceed with procedure.  - Labs today: TTG, IgA - Start Benefiber 1 tablespoon daily - Start MiraLAX 1 capful daily - Pending findings on colonoscopy and response to MiraLAX, consider trial of Linzess   Camie Furbish, PA-C  Gastroenterology 06/21/2024, 11:16 AM  Patient Care Team: Wendolyn Jenkins Jansky, MD as PCP - General (Family Medicine)

## 2024-06-21 NOTE — Patient Instructions (Addendum)
 _______________________________________________________  If your blood pressure at your visit was 140/90 or greater, please contact your primary care physician to follow up on this.  _______________________________________________________  If you are age 28 or older, your body mass index should be between 23-30. Your Body mass index is 23.75 kg/m. If this is out of the aforementioned range listed, please consider follow up with your Primary Care Provider.  If you are age 41 or younger, your body mass index should be between 19-25. Your Body mass index is 23.75 kg/m. If this is out of the aformentioned range listed, please consider follow up with your Primary Care Provider.   ________________________________________________________  The [facility] providers would like to encourage you to use MYCHART to communicate with providers for non-urgent requests or questions.  Due to long hold times on the telephone, sending your provider a message by [facility] may be a faster and more efficient way to get a response.  Please allow 48 business hours for a response.  Please remember that this is for non-urgent requests.  _______________________________________________________  [facility] is using a team-based approach to care.  Your team is made up of your doctor and two to three APPS. Our APPS (Nurse Practitioners and Physician Assistants) work with your physician to ensure care continuity for you. They are fully qualified to address your health concerns and develop a treatment plan. They communicate directly with your gastroenterologist to care for you. Seeing the Advanced Practice Practitioners on your physician's team can help you by facilitating care more promptly, often allowing for earlier appointments, access to diagnostic testing, procedures, and other specialty referrals.   Your provider has requested that you go to the basement level for lab work before leaving today. Press B on the  elevator. The lab is located at the first door on the left as you exit the elevator.  Please purchase the following medications over the counter and take as directed:  START: Benefiber 1 tablespoon daily START: Miralax 1 capful daily  You have been scheduled for a colonoscopy. Please follow written instructions given to you at your visit today.   If you use inhalers (even only as needed), please bring them with you on the day of your procedure.  DO NOT TAKE 7 DAYS PRIOR TO TEST- Trulicity (dulaglutide) Ozempic, Wegovy (semaglutide) Mounjaro (tirzepatide) Bydureon Bcise (exanatide extended release)  DO NOT TAKE 1 DAY PRIOR TO YOUR TEST Rybelsus (semaglutide) Adlyxin (lixisenatide) Victoza (liraglutide) Byetta (exanatide) ___________________________________________________________________________   Due to recent changes in healthcare laws, you may see the results of your imaging and laboratory studies on MyChart before your provider has had a chance to review them.  We understand that in some cases there may be results that are confusing or concerning to you. Not all laboratory results come back in the same time frame and the provider may be waiting for multiple results in order to interpret others.  Please give us  48 hours in order for your provider to thoroughly review all the results before contacting the office for clarification of your results.   Thank you for entrusting me with your care and choosing [facility].  Camie Furbish, PA-C

## 2024-06-21 NOTE — Progress Notes (Signed)
 Attending Physician's Attestation   I have reviewed the chart.   I agree with the Advanced Practitioner's note, impression, and recommendations with any updates as below.    Corliss Parish, MD [facility] Advanced Endoscopy Office # [phone]

## 2024-06-23 LAB — IGA: Immunoglobulin A: 395 mg/dL — ABNORMAL HIGH (ref 47–310)

## 2024-06-23 LAB — TISSUE TRANSGLUTAMINASE, IGA: (tTG) Ab, IgA: <1 U/mL

## 2024-06-24 ENCOUNTER — Ambulatory Visit: Payer: Self-pay | Admitting: Gastroenterology

## 2024-06-28 ENCOUNTER — Ambulatory Visit (AMBULATORY_SURGERY_CENTER): Admitting: Gastroenterology

## 2024-06-28 ENCOUNTER — Encounter: Payer: Self-pay | Admitting: Gastroenterology

## 2024-06-28 VITALS — BP 104/67 | HR 57 | Temp 97.5°F | Resp 16 | Ht 75.0 in | Wt 195.0 lb

## 2024-06-28 DIAGNOSIS — K635 Polyp of colon: Secondary | ICD-10-CM | POA: Diagnosis not present

## 2024-06-28 DIAGNOSIS — D123 Benign neoplasm of transverse colon: Secondary | ICD-10-CM

## 2024-06-28 DIAGNOSIS — R1031 Right lower quadrant pain: Secondary | ICD-10-CM | POA: Diagnosis not present

## 2024-06-28 DIAGNOSIS — K625 Hemorrhage of anus and rectum: Secondary | ICD-10-CM | POA: Diagnosis not present

## 2024-06-28 DIAGNOSIS — K640 First degree hemorrhoids: Secondary | ICD-10-CM

## 2024-06-28 DIAGNOSIS — R194 Change in bowel habit: Secondary | ICD-10-CM

## 2024-06-28 MED ORDER — SODIUM CHLORIDE 0.9 % IV SOLN: 500.0000 mL | Freq: Once | INTRAVENOUS | Status: DC

## 2024-06-28 NOTE — Op Note (Signed)
 [facility] Patient Name: Adam Bernard Procedure Date: 06/28/2024 2:07 PM MRN: [id number] Endoscopist: Aloha Finner , MD, [id number] Age: 28 Referring MD:  Date of Birth: 1995-12-27 Gender: Male Account #: [id number] Procedure:                Colonoscopy Indications:              Abdominal pain in the right lower quadrant, Change                            in bowel habits, Constipation, Incidental change in                            bowel habits noted Medicines:                Monitored Anesthesia Care Procedure:                Pre-Anesthesia Assessment:                           - Prior to the procedure, a History and Physical                            was performed, and patient medications and                            allergies were reviewed. The patient's tolerance of                            previous anesthesia was also reviewed. The risks                            and benefits of the procedure and the sedation                            options and risks were discussed with the patient.                            All questions were answered, and informed consent                            was obtained. Prior Anticoagulants: The patient has                            taken no anticoagulant or antiplatelet agents. ASA                            Grade Assessment: II - A patient with mild systemic                            disease. After reviewing the risks and benefits,                            the patient was deemed in satisfactory condition to  undergo the procedure.                           After obtaining informed consent, the colonoscope                            was passed under direct vision. Throughout the                            procedure, the patient's blood pressure, pulse, and                            oxygen saturations were monitored continuously. The                            CF [id number] #[id number] was introduced  through the anus                            and advanced to the 8 cm into the ileum. The                            colonoscopy was performed without difficulty. The                            patient tolerated the procedure. The quality of the                            bowel preparation was good. The terminal ileum,                            ileocecal valve, appendiceal orifice, and rectum                            were photographed. Scope In: 2:21:49 PM Scope Out: 2:37:32 PM Scope Withdrawal Time: 0 hours 12 minutes 39 seconds  Total Procedure Duration: 0 hours 15 minutes 43 seconds  Findings:                 The digital rectal exam was normal. Pertinent                            negatives include no palpable rectal lesions.                           The terminal ileum and ileocecal valve appeared                            normal.                           Two sessile polyps were found in the transverse                            colon and hepatic flexure. The polyps were 3 to 4  mm in size. These polyps were removed with a cold                            snare. Resection and retrieval were complete.                           Normal mucosa was found in the entire colon                            otherwise.                           Non-bleeding non-thrombosed internal hemorrhoids                            were found during retroflexion. The hemorrhoids                            were Grade I (internal hemorrhoids that do not                            prolapse). Complications:            No immediate complications. Estimated Blood Loss:     Estimated blood loss was minimal. Impression:               - The examined portion of the ileum was normal.                           - Two 3 to 4 mm polyps in the transverse colon and                            at the hepatic flexure, removed with a cold snare.                            Resected and retrieved.                            - Normal mucosa in the entire examined colon                            otherwise.                           - Non-bleeding non-thrombosed internal hemorrhoids. Recommendation:           - The patient will be observed post-procedure,                            until all discharge criteria are met.                           - Discharge patient to home.                           - Patient has a contact number available for  emergencies. The signs and symptoms of potential                            delayed complications were discussed with the                            patient. Return to normal activities tomorrow.                            Written discharge instructions were provided to the                            patient.                           - High fiber diet.                           - Use FiberCon 1-2 tablets PO daily.                           - Continue present medications.                           - Await pathology results.                           - Repeat colonoscopy in 3 years for surveillance if                            adenomatous tissue is found on the colon polyps                            otherwise normal average risk screening will be                            recommended.                           - The findings and recommendations were discussed                            with the patient.                           - The findings and recommendations were discussed                            with the patient's family. Aloha Finner, MD 06/28/2024 2:42:13 PM

## 2024-06-28 NOTE — Progress Notes (Unsigned)
 GASTROENTEROLOGY PROCEDURE H&P NOTE   Primary Care Physician: Wendolyn Jenkins Jansky, MD  HPI: Adam Bernard is a 28 y.o. male who presents for Colonoscopy for evaluation of RLQ pain and change in bowel habits.  Past Medical History:  Diagnosis Date   Allergy    Eczema    Past Surgical History:  Procedure Laterality Date   MOUTH SURGERY     wisdom teeth   Current Outpatient Medications  Medication Sig Dispense Refill   montelukast  (SINGULAIR ) 10 MG tablet Take 1 tablet (10 mg total) by mouth at bedtime. (Patient not taking: Reported on 06/21/2024) 90 tablet 3   Na Sulfate-K Sulfate-Mg Sulfate concentrate (SUPREP BOWEL PREP KIT) 17.5-3.13-1.6 GM/177ML SOLN Take 1 kit (354 mLs total) by mouth as directed. 324 mL 0   triamcinolone  (NASACORT ) 55 MCG/ACT AERO nasal inhaler Place 2 sprays into the nose daily. (Patient not taking: Reported on 06/21/2024) 3 each 3   triamcinolone  cream (KENALOG ) 0.1 % Apply 1 Application topically 2 (two) times daily. (Patient not taking: Reported on 06/21/2024) 30 g 1   No current facility-administered medications for this visit.    Current Outpatient Medications:    montelukast  (SINGULAIR ) 10 MG tablet, Take 1 tablet (10 mg total) by mouth at bedtime. (Patient not taking: Reported on 06/21/2024), Disp: 90 tablet, Rfl: 3   Na Sulfate-K Sulfate-Mg Sulfate concentrate (SUPREP BOWEL PREP KIT) 17.5-3.13-1.6 GM/177ML SOLN, Take 1 kit (354 mLs total) by mouth as directed., Disp: 324 mL, Rfl: 0   triamcinolone  (NASACORT ) 55 MCG/ACT AERO nasal inhaler, Place 2 sprays into the nose daily. (Patient not taking: Reported on 06/21/2024), Disp: 3 each, Rfl: 3   triamcinolone  cream (KENALOG ) 0.1 %, Apply 1 Application topically 2 (two) times daily. (Patient not taking: Reported on 06/21/2024), Disp: 30 g, Rfl: 1 Allergies  Allergen Reactions   Iodinated Contrast Media Itching    Pt aware will need 13 hr prep in the future 04/11/24   Family History  Problem Relation Age of  Onset   Migraines Mother    Asthma Brother    Colon cancer Neg Hx    Esophageal cancer Neg Hx    Social History   Socioeconomic History   Marital status: Single    Spouse name: Not on file   Number of children: 1   Years of education: Not on file   Highest education level: 12th grade  Occupational History   Occupation: maintenance tech  Tobacco Use   Smoking status: Never   Smokeless tobacco: Never  Vaping Use   Vaping status: Former  Substance and Sexual Activity   Alcohol use: Not Currently    Alcohol/week: 7.0 standard drinks of alcohol    Types: 4 Glasses of wine, 3 Shots of liquor per week    Comment: 1-2/day wine   Drug use: Yes    Types: Marijuana    Comment: on weekends   Sexual activity: Yes    Birth control/protection: None  Other Topics Concern   Not on file  Social History Narrative   engaged   Social Drivers of Health   Financial Resource Strain: Low Risk  (11/03/2023)   Overall Financial Resource Strain (CARDIA)    Difficulty of Paying Living Expenses: Not very hard  Food Insecurity: No Food Insecurity (11/03/2023)   Hunger Vital Sign    Worried About Running Out of Food in the Last Year: Never true    Ran Out of Food in the Last Year: Never true  Transportation Needs: No  Transportation Needs (11/03/2023)   PRAPARE - Administrator, Civil Service (Medical): No    Lack of Transportation (Non-Medical): No  Physical Activity: Sufficiently Active (11/03/2023)   Exercise Vital Sign    Days of Exercise per Week: 3 days    Minutes of Exercise per Session: 50 min  Stress: No Stress Concern Present (11/03/2023)   [employer] of Occupational Health - Occupational Stress Questionnaire    Feeling of Stress : Not at all  Social Connections: Unknown (11/03/2023)   Social Connection and Isolation Panel    Frequency of Communication with Friends and Family: More than three times a week    Frequency of Social Gatherings with Friends and Family: Once a week     Attends Religious Services: More than 4 times per year    Active Member of [employer] or Organizations: Patient declined    Attends Banker Meetings: Not on file    Marital Status: Living with partner  Intimate Partner Violence: Not on file    Physical Exam: There were no vitals filed for this visit. There is no height or weight on file to calculate BMI. GEN: NAD EYE: Sclerae anicteric ENT: MMM CV: Non-tachycardic GI: Soft, NT/ND NEURO:  Alert & Oriented x 3  Lab Results: No results for input(s): WBC, HGB, HCT, PLT in the last 72 hours. BMET No results for input(s): NA, K, CL, CO2, GLUCOSE, BUN, CREATININE, CALCIUM in the last 72 hours. LFT No results for input(s): PROT, ALBUMIN, AST, ALT, ALKPHOS, BILITOT, BILIDIR, IBILI in the last 72 hours. PT/INR No results for input(s): LABPROT, INR in the last 72 hours.   Impression / Plan: This is a 27 y.o.male who presents for Colonoscopy for evaluation of RLQ pain and change in bowel habits.  The risks and benefits of endoscopic evaluation/treatment were discussed with the patient and/or family; these include but are not limited to the risk of perforation, infection, bleeding, missed lesions, lack of diagnosis, severe illness requiring hospitalization, as well as anesthesia and sedation related illnesses.  The patient's history has been reviewed, patient examined, no change in status, and deemed stable for procedure.  The patient and/or family is agreeable to proceed.    Aloha Finner, MD [facility] Office # [phone]

## 2024-06-28 NOTE — Progress Notes (Unsigned)
 Vss nad trans to pacu

## 2024-06-28 NOTE — Patient Instructions (Addendum)
 Handouts given: Polyps, Hemorrhoids, High-Fiber Diet High fiber diet. Use FiberCon 1-2 tablets by mouth daily. Continue present medications.  Await pathology results. Repeat colonoscopy in 3 years for surveillance is adenomatous tissue is found on the colon polyps, otherwise normal average risk screening will be recommended.  YOU HAD AN ENDOSCOPIC PROCEDURE TODAY AT THE [facility]:   Refer to the procedure report that was given to you for any specific questions about what was found during the examination.  If the procedure report does not answer your questions, please call your gastroenterologist to clarify.  If you requested that your care partner not be given the details of your procedure findings, then the procedure report has been included in a sealed envelope for you to review at your convenience later.  YOU SHOULD EXPECT: Some feelings of bloating in the abdomen. Passage of more gas than usual.  Walking can help get rid of the air that was put into your GI tract during the procedure and reduce the bloating. If you had a lower endoscopy (such as a colonoscopy or flexible sigmoidoscopy) you may notice spotting of blood in your stool or on the toilet paper. If you underwent a bowel prep for your procedure, you may not have a normal bowel movement for a few days.  Please Note:  You might notice some irritation and congestion in your nose or some drainage.  This is from the oxygen used during your procedure.  There is no need for concern and it should clear up in a day or so.  SYMPTOMS TO REPORT IMMEDIATELY:  Following lower endoscopy (colonoscopy or flexible sigmoidoscopy):  Excessive amounts of blood in the stool  Significant tenderness or worsening of abdominal pains  Swelling of the abdomen that is new, acute  Fever of 100F or higher  For urgent or emergent issues, a gastroenterologist can be reached at any hour by calling [phone]. Do not use MyChart messaging for  urgent concerns.    DIET:  We do recommend a small meal at first, but then you may proceed to your regular diet.  Drink plenty of fluids but you should avoid alcoholic beverages for 24 hours.  ACTIVITY:  You should plan to take it easy for the rest of today and you should NOT DRIVE or use heavy machinery until tomorrow (because of the sedation medicines used during the test).    FOLLOW UP: Our staff will call the number listed on your records the next business day following your procedure.  We will call around 7:15- 8:00 am to check on you and address any questions or concerns that you may have regarding the information given to you following your procedure. If we do not reach you, we will leave a message.     If any biopsies were taken you will be contacted by phone or by letter within the next 1-3 weeks.  Please call us  at [phone] if you have not heard about the biopsies in 3 weeks.    SIGNATURES/CONFIDENTIALITY: You and/or your care partner have signed paperwork which will be entered into your electronic medical record.  These signatures attest to the fact that that the information above on your After Visit Summary has been reviewed and is understood.  Full responsibility of the confidentiality of this discharge information lies with you and/or your care-partner.

## 2024-06-28 NOTE — Progress Notes (Unsigned)
VS by SM.

## 2024-06-28 NOTE — Progress Notes (Unsigned)
 Called to room to assist during endoscopic procedure.  Patient ID and intended procedure confirmed with present staff. Received instructions for my participation in the procedure from the performing physician.

## 2024-06-29 ENCOUNTER — Telehealth: Payer: Self-pay | Admitting: *Deleted

## 2024-06-29 NOTE — Telephone Encounter (Signed)
  Follow up Call-     06/28/2024    1:39 PM  Call back number  Post procedure Call Back phone  # [phone]  Permission to leave phone message Yes     Patient questions:  Do you have a fever, pain , or abdominal swelling? No. Pain Score  0 *  Have you tolerated food without any problems? Yes.    Have you been able to return to your normal activities? Yes.    Do you have any questions about your discharge instructions: Diet   No. Medications  No. Follow up visit  No.  Do you have questions or concerns about your Care? No.  Actions: * If pain score is 4 or above: No action needed, pain <4.

## 2024-07-01 LAB — SURGICAL PATHOLOGY

## 2024-07-02 ENCOUNTER — Ambulatory Visit: Payer: Self-pay | Admitting: Gastroenterology

## 2024-07-28 ENCOUNTER — Encounter: Payer: Self-pay | Admitting: Family Medicine

## 2024-07-28 ENCOUNTER — Ambulatory Visit (INDEPENDENT_AMBULATORY_CARE_PROVIDER_SITE_OTHER): Payer: Managed Care, Other (non HMO) | Admitting: Family Medicine

## 2024-07-28 VITALS — BP 128/78 | HR 71 | Temp 97.9°F | Ht 75.0 in | Wt 195.0 lb

## 2024-07-28 DIAGNOSIS — J301 Allergic rhinitis due to pollen: Secondary | ICD-10-CM

## 2024-07-28 DIAGNOSIS — Z0000 Encounter for general adult medical examination without abnormal findings: Secondary | ICD-10-CM

## 2024-07-28 DIAGNOSIS — L2082 Flexural eczema: Secondary | ICD-10-CM

## 2024-07-28 MED ORDER — MONTELUKAST SODIUM 10 MG PO TABS: 10.0000 mg | ORAL_TABLET | Freq: Every day | ORAL | 3 refills | Status: AC

## 2024-07-28 MED ORDER — TRIAMCINOLONE ACETONIDE 55 MCG/ACT NA AERO: 2.0000 | INHALATION_SPRAY | Freq: Every day | NASAL | 3 refills | Status: AC

## 2024-07-28 MED ORDER — TRIAMCINOLONE ACETONIDE 0.1 % EX CREA
1.0000 | TOPICAL_CREAM | Freq: Two times a day (BID) | CUTANEOUS | 1 refills | Status: AC
Start: 2024-07-28 — End: ?

## 2024-07-28 MED ORDER — CLINDAMYCIN PHOSPHATE 1 % EX SWAB: 1.0000 | Freq: Two times a day (BID) | CUTANEOUS | 3 refills | Status: AC

## 2024-07-28 NOTE — Patient Instructions (Signed)

## 2024-07-28 NOTE — Progress Notes (Signed)
 Phone: [phone]   Subjective:  Patient 28 y.o. male presenting for annual physical.  Chief Complaint  Patient presents with   Annual Exam    Physical;     Discussed the use of AI scribe software for clinical note transcription with the patient, who gave verbal consent to proceed.  History of Present Illness Adam Bernard is a 28 year old male who presents for an annual physical exam.  He reports a history of precancerous polyps removed during a recent gastrointestinal evaluation. He reports occasional abdominal pain and is unsure if colon cancer runs in his family, though he mentions some type of cancer does.  He experiences recurrent boils on his right thigh, appearing approximately every two months. These boils are usually singular, sometimes painful to touch, and take a long time to resolve. The current boil has been present for about a month and is the smallest it has been. He describes it as possibly fluid-filled, but no drainage has been observed.  He reports occasional constipation, which he manages with Benefiber and a green supplement taken daily. No issues with urination, erections, or significant muscle or joint pain. No chest pain, heart racing, coughing, shortness of breath, vomiting, diarrhea, or depression.  He uses montelukast  and Nasacort  for allergies and applies triamcinolone  cream for eczema on spots on his shoulder and back, which have lightened over time.  He exercises daily, is working on reducing marijuana use, and is planning to return to college. He is engaged and planning a wedding for next year.    See problem oriented charting- ROS- ROS: Gen: no fever, chills  Skin: groin R boil intermitt ENT: no ear pain, ear drainage, nasal congestion, rhinorrhea, sinus pressure, sore throat Eyes: no blurry vision, double vision Resp: no cough, wheeze,SOB CV: no CP, palpitations, LE edema,  GI: no heartburn, n/v/d.  Taking benefiber.  Chronic RLQ pain. GU: no  dysuria, urgency, frequency, hematuria MSK: no joint pain, myalgias, back pain Neuro: no dizziness, headache, weakness, vertigo Psych: no depression, anxiety, insomnia, SI   The following were reviewed and entered/updated in epic: Past Medical History:  Diagnosis Date   Allergy    Eczema    Patient Active Problem List   Diagnosis Date Noted   Non-seasonal allergic rhinitis due to pollen 07/27/2023   Flexural eczema 05/15/2023   Past Surgical History:  Procedure Laterality Date   MOUTH SURGERY     wisdom teeth    Family History  Problem Relation Age of Onset   Migraines Mother    Asthma Brother    Colon cancer Neg Hx    Esophageal cancer Neg Hx    Stomach cancer Neg Hx    Rectal cancer Neg Hx     Medications- reviewed and updated Current Outpatient Medications  Medication Sig Dispense Refill   clindamycin (CLEOCIN T) 1 % SWAB Apply 1 application  topically 2 (two) times daily. 60 each 3   montelukast  (SINGULAIR ) 10 MG tablet Take 1 tablet (10 mg total) by mouth at bedtime. 90 tablet 3   triamcinolone  (NASACORT ) 55 MCG/ACT AERO nasal inhaler Place 2 sprays into the nose daily. 3 each 3   triamcinolone  cream (KENALOG ) 0.1 % Apply 1 Application topically 2 (two) times daily. 30 g 1   No current facility-administered medications for this visit.    Allergies-reviewed and updated Allergies  Allergen Reactions   Iodinated Contrast Media Itching    Pt aware will need 13 hr prep in the future 04/11/24  Social History   Social History Narrative   engaged   Objective  Objective:  BP 128/78   Pulse 71   Temp 97.9 F (36.6 C)   Ht 6' 3 (1.905 m)   Wt 195 lb (88.5 kg)   SpO2 98%   BMI 24.37 kg/m  Physical Exam  Gen: WDWN NAD HEENT: NCAT, conjunctiva not injected, sclera nonicteric TM WNL B, OP moist, no exudates  NECK:  supple, no thyromegaly, no nodes, no carotid bruits CARDIAC: RRR, S1S2+, no murmur. DP 2+B LUNGS: CTAB. No wheezes ABDOMEN:  BS+, soft,  NTND, No HSM, no masses EXT:  no edema MSK: no gross abnormalities. MS 5/5 all 4 NEURO: A&O x3.  CN II-XII intact.  PSYCH: normal mood. Good eye contact   R groin-resolving nodule-more upper thigh near groin.   2 hypopigmented spots L upper back on 1 L neck(per pt, his eczema)  Reviewed labs   Assessment and Plan   Health Maintenance counseling: 1. Anticipatory guidance: Patient counseled regarding regular dental exams q6 months, eye exams yearly, avoiding smoking and second hand smoke, limiting alcohol to 2 beverages per day.   2. Risk factor reduction:  Advised patient of need for regular exercise and diet rich in fruits and vegetables to reduce risk of heart attack and stroke. Exercise- +.   Wt Readings from Last 3 Encounters:  07/28/24 195 lb (88.5 kg)  06/28/24 195 lb (88.5 kg)  06/21/24 190 lb (86.2 kg)   3. Immunizations/screenings/ancillary studies Immunization History  Administered Date(s) Administered   DTaP 09/19/1996, 12/12/1996, 02/13/1997, 12/17/1997, 06/29/2000   Dtap, Unspecified 09/19/1996, 12/12/1996, 02/13/1997, 12/17/1997, 06/29/2000   HIB (PRP-OMP) 08/20/1996, 12/12/1996, 02/13/1997, 11/28/1997   HIB, Unspecified 08/20/1996, 12/12/1996, 02/13/1997, 11/28/1997   HPV Quadrivalent 11/14/2009, 01/22/2010, 05/27/2010   Hepatitis B [birth year] 1996, 09/19/1996, 02/13/1997   IPV 09/19/1996, 12/12/1996, 02/13/1997, 06/29/2000   Influenza-Unspecified 07/22/2001, 08/23/2001, 07/09/2010, 05/28/2012, 08/03/2014   MMR 11/28/1997, 06/29/2000   Polio, Unspecified 09/19/1996, 12/12/1996, 02/13/1997, 06/29/2000   Varicella 11/28/1997   There are no preventive care reminders to display for this patient.   4. Skin cancer screening- Iadvised regular sunscreen use. Denies worrisome, changing, or new skin lesions.  5. Smoking associated screening: non smoker -   Wellness examination  Non-seasonal allergic rhinitis due to pollen -     Montelukast  Sodium; Take 1 tablet (10 mg  total) by mouth at bedtime.  Dispense: 90 tablet; Refill: 3 -     Triamcinolone  Acetonide; Place 2 sprays into the nose daily.  Dispense: 3 each; Refill: 3  Flexural eczema -     Triamcinolone  Acetonide; Apply 1 Application topically 2 (two) times daily.  Dispense: 30 g; Refill: 1  Other orders -     Clindamycin Phosphate; Apply 1 application  topically 2 (two) times daily.  Dispense: 60 each; Refill: 3    Assessment and Plan Assessment & Plan Adult Wellness Visit   He is engaged in regular exercise and working on reducing marijuana use. No new surgeries or changes in job status. He is planning a wedding for next year. There is no significant family history of colon cancer, but there is a family history of some cancer. Blood work from GI was normal, and cholesterol levels were good last year. Schedule physical for next year. Advise maintaining a healthy lifestyle, including proper nutrition, avoiding drinking and driving, and reducing marijuana use. Recommend wearing sunscreen and ensuring firearms are locked and safe.  Colonic polyps   Precancerous colonic polyps were removed. He is  advised to return for a colonoscopy in three years due to age and findings. Follow-up with gastroenterology is scheduled for November 3rd.  Allergic rhinitis due to pollen   Allergic rhinitis is managed with montelukast  and Nasacort  nasal spray. He continues to experience symptoms and requires refills. Send refill for montelukast  and Nasacort  nasal spray.  Flexural eczema   Chronic eczema presents with depigmented patches on the shoulder and back. Triamcinolone  cream has been used, but effectiveness is uncertain. Patches are not raised and appear lighter than before. Send refill for triamcinolone  cream. Advise using triamcinolone  cream twice a day as needed during eczema flare-ups.  Recurrent right groin boil   A recurrent boil on the right groin appears every couple of months and lasts about a month. It is  currently small and not draining pus. Differential includes a boil or possibly a hair follicle issue. Advise on hygiene and potential shaving precautions. Use a clean razor and alcohol before shaving to reduce bacterial counts. Send topical clinda for use when the boil misbehaves.  Constipation   Intermittent constipation is managed with Benefiber and a green supplement. Symptoms have improved with the current regimen.     Recommended follow up: Return in about 1 year (around 07/28/2025) for annual physical.  Lab/Order associations:n/a fasting  Jenkins CHRISTELLA Carrel, MD

## 2024-08-01 ENCOUNTER — Ambulatory Visit: Admitting: Gastroenterology

## 2024-08-01 NOTE — Progress Notes (Deleted)
 Adam Bernard [id number] [birth year]   Chief Complaint:  Referring Provider: Wendolyn Jenkins Jansky, MD Primary GI MD: Dr. Wilhelmenia  HPI: Adam Bernard is a 28 y.o. male with past medical history of allergies, eczema who presents today for a complaint of *** .    Seen by PCP 06/07/2024 for complaint of right lower quadrant pain associated with constipation and bloating which had notably worsened over time.  Previous CT did show evidence of possible enteritis.  Referred to GI for further evaluation.  Referred to surgery for evaluation of possible appendicitis.   Labs 06/07/2024 showed WBC 3.5 (appears to be chronically low), hemoglobin 14.9, elevated MCV 101.4, normal CMP, negative inflammatory markers.  Initially seen in office 06/21/2024 for worsening constipation, bloating, RLQ abdominal pain ongoing for about a year.  CT in July showed a normal appendix and nonspecific stasis of material within ileal loops of the pelvis which could be seen with enteritis.  Endorsed some straining with bowel movements and associated BRBPR.  RLQ pain present most of the time.  Scheduled for colonoscopy and advised to start Benefiber and MiraLAX.  Labs negative for celiac disease.  Underwent colonoscopy 06/28/2024 with 2 polyps removed, 1 of which was a tubular adenoma.  Advised to have repeat colonoscopy in 3 years.  Discussed the use of AI scribe software for clinical note transcription with the patient, who gave verbal consent to proceed.  History of Present Illness       Previous GI Procedures/Imaging   Colonoscopy 06/28/2024 - The examined portion of the ileum was normal.  - Two 3 to 4 mm polyps in the transverse colon and at the hepatic flexure, removed with a cold snare. Resected and retrieved.  - Normal mucosa in the entire examined colon otherwise.  - Non- bleeding non- thrombosed internal hemorrhoids. - Recall 3 years Path: 1. Surgical [P], colon, transverse, hepatic flexure, polyp (2) :        -  TUBULAR ADENOMA.       -  SEPARATE FRAGMENTS OF COLONIC MUCOSA WITH SUPERFICIAL HYPERPLASTIC CHANGE.    CT A/P 04/11/2024 1. No acute localizing process in the abdomen or pelvis. 2. Normal appendix. 3. Nonspecific stasis of material within ileal loops in the pelvis. This can be seen enteritis.   Past Medical History:  Diagnosis Date   Allergy    Eczema     Past Surgical History:  Procedure Laterality Date   MOUTH SURGERY     wisdom teeth    Current Outpatient Medications  Medication Sig Dispense Refill   clindamycin (CLEOCIN T) 1 % SWAB Apply 1 application  topically 2 (two) times daily. 60 each 3   montelukast  (SINGULAIR ) 10 MG tablet Take 1 tablet (10 mg total) by mouth at bedtime. 90 tablet 3   triamcinolone  (NASACORT ) 55 MCG/ACT AERO nasal inhaler Place 2 sprays into the nose daily. 3 each 3   triamcinolone  cream (KENALOG ) 0.1 % Apply 1 Application topically 2 (two) times daily. 30 g 1   No current facility-administered medications for this visit.    Allergies as of 08/01/2024 - Review Complete 07/28/2024  Allergen Reaction Noted   Iodinated contrast media Itching 04/11/2024    Family History  Problem Relation Age of Onset   Migraines Mother    Asthma Brother    Colon cancer Neg Hx    Esophageal cancer Neg Hx    Stomach cancer Neg Hx    Rectal cancer Neg Hx     Social History  Tobacco Use   Smoking status: Never   Smokeless tobacco: Never  Vaping Use   Vaping status: Former  Substance Use Topics   Alcohol use: Yes    Alcohol/week: 7.0 standard drinks of alcohol    Types: 4 Glasses of wine, 3 Shots of liquor per week    Comment: occasional   Drug use: Yes    Types: Marijuana    Comment: on weekends     Review of Systems:    Constitutional: No weight loss, fever, chills, weakness or fatigue Eyes: No change in vision Ears, Nose, Throat:  No change in hearing or congestion Skin: No rash or itching Cardiovascular: No chest pain, chest  pressure or palpitations   Respiratory: No SOB or cough Gastrointestinal: See HPI and otherwise negative Genitourinary: No dysuria or change in urinary frequency Neurological: No headache, dizziness or syncope Musculoskeletal: No new muscle or joint pain Hematologic: No bleeding or bruising    Physical Exam:  Vital signs: There were no vitals taken for this visit.  Constitutional: NAD, Well developed, Well nourished, alert and cooperative Head:  Normocephalic and atraumatic.  Eyes: No scleral icterus. Conjunctiva pink. Mouth: No oral lesions. Respiratory: Respirations even and unlabored. Lungs clear to auscultation bilaterally.  No wheezes, crackles, or rhonchi.  Cardiovascular:  Regular rate and rhythm. No murmurs. No peripheral edema. Gastrointestinal:  Soft, nondistended, nontender. No rebound or guarding. Normal bowel sounds. No appreciable masses or hepatomegaly. Rectal:  Not performed.  Neurologic:  Alert and oriented x4;  grossly normal neurologically.  Skin:   Dry and intact without significant lesions or rashes. Psychiatric: Oriented to person, place and time. Demonstrates good judgement and reason without abnormal affect or behaviors.   RELEVANT LABS AND IMAGING: CBC    Component Value Date/Time   WBC 3.5 (L) 06/07/2024 1437   RBC 4.38 06/07/2024 1437   HGB 14.9 06/07/2024 1437   HCT 44.4 06/07/2024 1437   PLT 178.0 06/07/2024 1437   MCV 101.4 (H) 06/07/2024 1437   MCHC 33.5 06/07/2024 1437   RDW 12.6 06/07/2024 1437   LYMPHSABS 1.8 06/07/2024 1437   MONOABS 0.4 06/07/2024 1437   EOSABS 0.1 06/07/2024 1437   BASOSABS 0.0 06/07/2024 1437    CMP     Component Value Date/Time   NA 138 06/07/2024 1437   K 4.2 06/07/2024 1437   CL 104 06/07/2024 1437   CO2 29 06/07/2024 1437   GLUCOSE 87 06/07/2024 1437   BUN 18 06/07/2024 1437   CREATININE 1.15 06/07/2024 1437   CALCIUM 9.4 06/07/2024 1437   PROT 7.0 06/07/2024 1437   ALBUMIN 4.1 06/07/2024 1437   AST 16  06/07/2024 1437   ALT 14 06/07/2024 1437   ALKPHOS 48 06/07/2024 1437   BILITOT 0.6 06/07/2024 1437     Assessment/Plan:    Assessment and Plan Assessment & Plan     Make sure on recall for colonoscopy in 3 years Follow up on constipation   Camie Furbish, PA-C [facility] 08/01/2024, 8:18 AM  Patient Care Team: Wendolyn Jenkins Jansky, MD as PCP - General (Family Medicine)

## 2024-08-29 HISTORY — PX: COLONOSCOPY: SHX174

## 2024-10-10 ENCOUNTER — Telehealth: Payer: Self-pay | Admitting: Family Medicine

## 2024-10-10 NOTE — Telephone Encounter (Signed)
 LVM to reschedule pt's 08/03/25 appt since the Provider will not longer be in office that day

## 2024-10-20 ENCOUNTER — Encounter: Payer: Self-pay | Admitting: Gastroenterology

## 2024-10-20 ENCOUNTER — Ambulatory Visit: Admitting: Gastroenterology

## 2024-10-20 VITALS — BP 118/62 | HR 69 | Ht 75.0 in | Wt 191.2 lb

## 2024-10-20 DIAGNOSIS — K5909 Other constipation: Secondary | ICD-10-CM | POA: Diagnosis not present

## 2024-10-20 DIAGNOSIS — R109 Unspecified abdominal pain: Secondary | ICD-10-CM | POA: Diagnosis not present

## 2024-10-20 DIAGNOSIS — Z860100 Personal history of colon polyps, unspecified: Secondary | ICD-10-CM

## 2024-10-20 DIAGNOSIS — K5900 Constipation, unspecified: Secondary | ICD-10-CM | POA: Insufficient documentation

## 2024-10-20 DIAGNOSIS — R1031 Right lower quadrant pain: Secondary | ICD-10-CM | POA: Insufficient documentation

## 2024-10-20 NOTE — Progress Notes (Signed)
 Attending Physician's Attestation   I have reviewed the chart.   I agree with the Advanced Practitioner's note, impression, and recommendations with any updates as below.    Corliss Parish, MD [facility] Advanced Endoscopy Office # [phone]

## 2024-10-20 NOTE — Progress Notes (Signed)
 "    10/20/2024 FELIZ LINCOLN [id number] 1996/05/17   Discussed the use of AI scribe software for clinical note transcription with the patient, who gave verbal consent to proceed.  History of Present Illness JOESIAH LONON is a 29 year old male with colonic polyps who presents for follow-up of persistent right lower abdominal pressure and constipation.  He is a patient of Dr. Melba.  Persistent right lower abdominal pressure has been present for several months, described as a constant sensation of fullness or swelling, most noticeable when lying flat or with direct pressure. Occasionally, this is accompanied by cramping or a twisting feeling. The pressure sometimes prompts the urge to pass gas, but the sensation does not fully resolve. Symptoms have remained unchanged since onset.  Chronic constipation is characterized by decreased stool volume, increased straining, and frequent sensation of incomplete evacuation compared to his previous baseline of daily morning bowel movements. He uses Miralax and Benefiber daily to maintain regularity, with intermittent use of Dulcolax and attempts to increase fiber intake through eating vegetables. Symptoms recur when Miralax is discontinued.  Constipation and pressure persist despite these interventions.   Colonoscopy 05/2024:  - The examined portion of the ileum was normal. - Two 3 to 4 mm polyps in the transverse colon and at the hepatic flexure, removed with a cold snare. Resected and retrieved. - Normal mucosa in the entire examined colon otherwise. - Non- bleeding non- thrombosed internal hemorrhoids.  1. Surgical [P], colon, transverse, hepatic flexure, polyp (2) :       -  TUBULAR ADENOMA.       -  SEPARATE FRAGMENTS OF COLONIC MUCOSA WITH SUPERFICIAL HYPERPLASTIC CHANGE.   Repeat recommended in 3 years.  Celiac labs, sed rate, CRP, CBC, CMP all normal/negative.  CT A/P 04/11/2024 1. No acute localizing process in the abdomen or  pelvis. 2. Normal appendix. 3. Nonspecific stasis of material within ileal loops in the pelvis. This can be seen enteritis.   Past Medical History:  Diagnosis Date   Allergy    Eczema    Past Surgical History:  Procedure Laterality Date   COLONOSCOPY  08/2024   MOUTH SURGERY     wisdom teeth    reports that he has never smoked. He has never used smokeless tobacco. He reports current alcohol use of about 7.0 standard drinks of alcohol per week. He reports current drug use. Drug: Marijuana. family history includes Asthma in his brother; Migraines in his mother. Allergies[1]    Outpatient Encounter Medications as of 10/20/2024  Medication Sig   clindamycin  (CLEOCIN  T) 1 % SWAB Apply 1 application  topically 2 (two) times daily.   montelukast  (SINGULAIR ) 10 MG tablet Take 1 tablet (10 mg total) by mouth at bedtime.   triamcinolone  (NASACORT ) 55 MCG/ACT AERO nasal inhaler Place 2 sprays into the nose daily.   triamcinolone  cream (KENALOG ) 0.1 % Apply 1 Application topically 2 (two) times daily.   No facility-administered encounter medications on file as of 10/20/2024.     REVIEW OF SYSTEMS  : All other systems reviewed and negative except where noted in the History of Present Illness.   PHYSICAL EXAM: BP 118/62   Pulse 69   Ht 6' 3 (1.905 m)   Wt 191 lb 4 oz (86.8 kg)   BMI 23.90 kg/m  General: Well developed AA male in no acute distress Head: Normocephalic and atraumatic Eyes:  Sclerae anicteric, conjunctiva pink. Ears: Normal auditory acuity Lungs: Clear throughout to auscultation; no W/R/R.  Heart: Regular rate and rhythm; no M/R/G. Abdomen: Soft, non-distended.  BS present.  Minimal right sided TTP. Musculoskeletal: Symmetrical with no gross deformities  Skin: No lesions on visible extremities Extremities: No edema  Neurological: Alert oriented x 4, grossly non-focal Psychological:  Alert and cooperative. Normal mood and affect Assessment & Plan Chronic  constipation and right sided abdominal pain Persistent, non-progressive symptoms consistent with functional constipation or possible IBS-C, without evidence of structural or obstructive pathology on prior workup. Symptoms may fluctuate with dietary and activity changes. - Recommended continuation of daily polyethylene glycol (Miralax) and fiber supplementation (Benefiber) on a daily basis. - Advised regular consumption of fruits, vegetables, and adequate hydration. - Advised to contact via MyChart if symptoms persist after several months of regular Miralax use; repeat abdominal CT may then be considered. - Provided reassurance regarding benign findings on prior colonoscopy and CT imaging.  Personal history of colonic polyps Requires surveillance due to young age. - Scheduled recall for repeat colonoscopy in 3 years.  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 - Documented IV contrast allergy in medical record. - Discussed need for premedication prior to any future imaging CT imaging with IV contrast.   CC:  Wendolyn Jenkins Jansky, MD        [1]  Allergies Allergen Reactions   Iodinated Contrast Media Itching    Pt aware will need 13 hr prep in the future 04/11/24   "

## 2024-10-20 NOTE — Patient Instructions (Signed)
 Start Miralax 1 capful daily in 8 ounces of liquid.  Call or send Mychart message in 2 months if right sided pain persist.  _______________________________________________________  If your blood pressure at your visit was 140/90 or greater, please contact your primary care physician to follow up on this.  _______________________________________________________  If you are age 29 or older, your body mass index should be between 23-30. Your Body mass index is 23.9 kg/m. If this is out of the aforementioned range listed, please consider follow up with your Primary Care Provider.  If you are age 3 or younger, your body mass index should be between 19-25. Your Body mass index is 23.9 kg/m. If this is out of the aformentioned range listed, please consider follow up with your Primary Care Provider.   ________________________________________________________  The [facility] providers would like to encourage you to use MYCHART to communicate with providers for non-urgent requests or questions.  Due to long hold times on the telephone, sending your provider a message by [facility] may be a faster and more efficient way to get a response.  Please allow 48 business hours for a response.  Please remember that this is for non-urgent requests.  _______________________________________________________  [facility] is using a team-based approach to care.  Your team is made up of your doctor and two to three APPS. Our APPS (Nurse Practitioners and Physician Assistants) work with your physician to ensure care continuity for you. They are fully qualified to address your health concerns and develop a treatment plan. They communicate directly with your gastroenterologist to care for you. Seeing the Advanced Practice Practitioners on your physician's team can help you by facilitating care more promptly, often allowing for earlier appointments, access to diagnostic testing, procedures, and other specialty  referrals.

## 2025-08-03 ENCOUNTER — Encounter: Admitting: Family Medicine
